# Patient Record
Sex: Female | Born: 1995
Health system: Southern US, Community
[De-identification: ages and names within clinical notes are randomized; demographics above are authoritative.]

## PROBLEM LIST (undated history)

## (undated) DIAGNOSIS — F329 Major depressive disorder, single episode, unspecified: Secondary | ICD-10-CM

## (undated) DIAGNOSIS — F32A Depression, unspecified: Secondary | ICD-10-CM

## (undated) DIAGNOSIS — F319 Bipolar disorder, unspecified: Secondary | ICD-10-CM

## (undated) DIAGNOSIS — J45909 Unspecified asthma, uncomplicated: Secondary | ICD-10-CM

## (undated) DIAGNOSIS — E282 Polycystic ovarian syndrome: Secondary | ICD-10-CM

## (undated) DIAGNOSIS — F419 Anxiety disorder, unspecified: Secondary | ICD-10-CM

## (undated) HISTORY — DX: Unspecified asthma, uncomplicated: J45.909

## (undated) HISTORY — DX: Bipolar disorder, unspecified: F31.9

## (undated) HISTORY — PX: OTHER SURGICAL HISTORY: SHX169

## (undated) HISTORY — DX: Anxiety disorder, unspecified: F41.9

## (undated) HISTORY — DX: Polycystic ovarian syndrome: E28.2

## (undated) HISTORY — PX: EYE SURGERY: SHX253

## (undated) HISTORY — DX: Depression, unspecified: F32.A

---
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
## 2019-02-02 DIAGNOSIS — Z5181 Encounter for therapeutic drug level monitoring: Secondary | ICD-10-CM | POA: Diagnosis not present

## 2019-02-02 DIAGNOSIS — F329 Major depressive disorder, single episode, unspecified: Secondary | ICD-10-CM | POA: Diagnosis not present

## 2019-05-09 DIAGNOSIS — F3181 Bipolar II disorder: Secondary | ICD-10-CM | POA: Diagnosis not present

## 2019-05-23 DIAGNOSIS — F3181 Bipolar II disorder: Secondary | ICD-10-CM | POA: Diagnosis not present

## 2019-05-31 DIAGNOSIS — F3181 Bipolar II disorder: Secondary | ICD-10-CM | POA: Diagnosis not present

## 2019-06-06 DIAGNOSIS — F3181 Bipolar II disorder: Secondary | ICD-10-CM | POA: Diagnosis not present

## 2019-06-07 DIAGNOSIS — Z3044 Encounter for surveillance of vaginal ring hormonal contraceptive device: Secondary | ICD-10-CM | POA: Diagnosis not present

## 2019-06-07 DIAGNOSIS — Z13220 Encounter for screening for lipoid disorders: Secondary | ICD-10-CM | POA: Diagnosis not present

## 2019-06-07 DIAGNOSIS — J45909 Unspecified asthma, uncomplicated: Secondary | ICD-10-CM | POA: Diagnosis not present

## 2019-06-07 DIAGNOSIS — Z131 Encounter for screening for diabetes mellitus: Secondary | ICD-10-CM | POA: Diagnosis not present

## 2019-06-07 DIAGNOSIS — Z0000 Encounter for general adult medical examination without abnormal findings: Secondary | ICD-10-CM | POA: Diagnosis not present

## 2019-06-07 DIAGNOSIS — H6991 Unspecified Eustachian tube disorder, right ear: Secondary | ICD-10-CM | POA: Diagnosis not present

## 2019-06-07 DIAGNOSIS — Z23 Encounter for immunization: Secondary | ICD-10-CM | POA: Diagnosis not present

## 2019-06-07 DIAGNOSIS — E282 Polycystic ovarian syndrome: Secondary | ICD-10-CM | POA: Diagnosis not present

## 2019-06-07 DIAGNOSIS — E669 Obesity, unspecified: Secondary | ICD-10-CM | POA: Diagnosis not present

## 2019-06-12 DIAGNOSIS — F3181 Bipolar II disorder: Secondary | ICD-10-CM | POA: Diagnosis not present

## 2019-06-19 DIAGNOSIS — F3181 Bipolar II disorder: Secondary | ICD-10-CM | POA: Diagnosis not present

## 2019-06-26 DIAGNOSIS — F3181 Bipolar II disorder: Secondary | ICD-10-CM | POA: Diagnosis not present

## 2019-07-03 DIAGNOSIS — F3181 Bipolar II disorder: Secondary | ICD-10-CM | POA: Diagnosis not present

## 2019-07-10 DIAGNOSIS — F3181 Bipolar II disorder: Secondary | ICD-10-CM | POA: Diagnosis not present

## 2019-07-17 DIAGNOSIS — F3181 Bipolar II disorder: Secondary | ICD-10-CM | POA: Diagnosis not present

## 2019-07-24 DIAGNOSIS — F3181 Bipolar II disorder: Secondary | ICD-10-CM | POA: Diagnosis not present

## 2019-07-31 DIAGNOSIS — F3181 Bipolar II disorder: Secondary | ICD-10-CM | POA: Diagnosis not present

## 2019-08-07 DIAGNOSIS — F3181 Bipolar II disorder: Secondary | ICD-10-CM | POA: Diagnosis not present

## 2019-08-14 DIAGNOSIS — F3181 Bipolar II disorder: Secondary | ICD-10-CM | POA: Diagnosis not present

## 2019-08-21 DIAGNOSIS — F3181 Bipolar II disorder: Secondary | ICD-10-CM | POA: Diagnosis not present

## 2019-08-28 DIAGNOSIS — F3181 Bipolar II disorder: Secondary | ICD-10-CM | POA: Diagnosis not present

## 2019-09-04 DIAGNOSIS — F3181 Bipolar II disorder: Secondary | ICD-10-CM | POA: Diagnosis not present

## 2019-09-11 DIAGNOSIS — Z23 Encounter for immunization: Secondary | ICD-10-CM | POA: Diagnosis not present

## 2019-09-11 DIAGNOSIS — R61 Generalized hyperhidrosis: Secondary | ICD-10-CM | POA: Diagnosis not present

## 2019-09-11 DIAGNOSIS — R6889 Other general symptoms and signs: Secondary | ICD-10-CM | POA: Diagnosis not present

## 2019-09-11 DIAGNOSIS — F3181 Bipolar II disorder: Secondary | ICD-10-CM | POA: Diagnosis not present

## 2019-09-18 DIAGNOSIS — F3181 Bipolar II disorder: Secondary | ICD-10-CM | POA: Diagnosis not present

## 2019-09-25 DIAGNOSIS — F3181 Bipolar II disorder: Secondary | ICD-10-CM | POA: Diagnosis not present

## 2019-10-02 DIAGNOSIS — F3181 Bipolar II disorder: Secondary | ICD-10-CM | POA: Diagnosis not present

## 2019-10-09 DIAGNOSIS — F3181 Bipolar II disorder: Secondary | ICD-10-CM | POA: Diagnosis not present

## 2019-10-17 DIAGNOSIS — F3181 Bipolar II disorder: Secondary | ICD-10-CM | POA: Diagnosis not present

## 2019-12-12 DIAGNOSIS — F3181 Bipolar II disorder: Secondary | ICD-10-CM | POA: Diagnosis not present

## 2019-12-19 DIAGNOSIS — F3181 Bipolar II disorder: Secondary | ICD-10-CM | POA: Diagnosis not present

## 2019-12-26 DIAGNOSIS — F3181 Bipolar II disorder: Secondary | ICD-10-CM | POA: Diagnosis not present

## 2020-01-02 DIAGNOSIS — F3181 Bipolar II disorder: Secondary | ICD-10-CM | POA: Diagnosis not present

## 2020-01-09 DIAGNOSIS — F3181 Bipolar II disorder: Secondary | ICD-10-CM | POA: Diagnosis not present

## 2020-01-16 DIAGNOSIS — F3181 Bipolar II disorder: Secondary | ICD-10-CM | POA: Diagnosis not present

## 2020-01-23 DIAGNOSIS — F3181 Bipolar II disorder: Secondary | ICD-10-CM | POA: Diagnosis not present

## 2020-01-30 DIAGNOSIS — F3181 Bipolar II disorder: Secondary | ICD-10-CM | POA: Diagnosis not present

## 2020-02-06 DIAGNOSIS — F3181 Bipolar II disorder: Secondary | ICD-10-CM | POA: Diagnosis not present

## 2020-02-20 DIAGNOSIS — F3181 Bipolar II disorder: Secondary | ICD-10-CM | POA: Diagnosis not present

## 2020-02-24 ENCOUNTER — Ambulatory Visit: Payer: Self-pay

## 2020-02-27 DIAGNOSIS — F3181 Bipolar II disorder: Secondary | ICD-10-CM | POA: Diagnosis not present

## 2020-03-02 ENCOUNTER — Ambulatory Visit: Payer: Self-pay

## 2020-03-05 DIAGNOSIS — F3181 Bipolar II disorder: Secondary | ICD-10-CM | POA: Diagnosis not present

## 2020-03-12 DIAGNOSIS — F3181 Bipolar II disorder: Secondary | ICD-10-CM | POA: Diagnosis not present

## 2020-03-14 ENCOUNTER — Encounter: Payer: Self-pay | Admitting: Family Medicine

## 2020-03-14 ENCOUNTER — Ambulatory Visit (INDEPENDENT_AMBULATORY_CARE_PROVIDER_SITE_OTHER): Payer: BC Managed Care – PPO | Admitting: Family Medicine

## 2020-03-14 VITALS — Ht 63.0 in | Wt 200.0 lb

## 2020-03-14 DIAGNOSIS — Z7689 Persons encountering health services in other specified circumstances: Secondary | ICD-10-CM

## 2020-03-14 DIAGNOSIS — Z79899 Other long term (current) drug therapy: Secondary | ICD-10-CM | POA: Diagnosis not present

## 2020-03-14 DIAGNOSIS — L700 Acne vulgaris: Secondary | ICD-10-CM | POA: Diagnosis not present

## 2020-03-14 DIAGNOSIS — F3160 Bipolar disorder, current episode mixed, unspecified: Secondary | ICD-10-CM

## 2020-03-14 DIAGNOSIS — M542 Cervicalgia: Secondary | ICD-10-CM

## 2020-03-14 DIAGNOSIS — R198 Other specified symptoms and signs involving the digestive system and abdomen: Secondary | ICD-10-CM

## 2020-03-14 DIAGNOSIS — J4520 Mild intermittent asthma, uncomplicated: Secondary | ICD-10-CM

## 2020-03-14 DIAGNOSIS — L680 Hirsutism: Secondary | ICD-10-CM

## 2020-03-14 DIAGNOSIS — H938X1 Other specified disorders of right ear: Secondary | ICD-10-CM

## 2020-03-14 MED ORDER — DICYCLOMINE HCL 10 MG PO CAPS: 10.0000 mg | ORAL_CAPSULE | Freq: Three times a day (TID) | ORAL | 1 refills | Status: AC

## 2020-03-14 MED ORDER — AZELASTINE HCL 0.1 % NA SOLN: 1.0000 | Freq: Two times a day (BID) | NASAL | 5 refills | Status: AC

## 2020-03-14 MED ORDER — ALBUTEROL SULFATE HFA 108 (90 BASE) MCG/ACT IN AERS: 2.0000 | INHALATION_SPRAY | Freq: Four times a day (QID) | RESPIRATORY_TRACT | 5 refills | Status: AC | PRN

## 2020-03-14 MED ORDER — SPIRONOLACTONE 100 MG PO TABS: 200.0000 mg | ORAL_TABLET | Freq: Every day | ORAL | 5 refills | Status: DC

## 2020-03-14 MED ORDER — TRETINOIN 0.075 % EX CREA: 1.0000 "application " | TOPICAL_CREAM | Freq: Every day | CUTANEOUS | 5 refills | Status: DC | PRN

## 2020-03-14 NOTE — Progress Notes (Signed)
Ht 5\' 3"  (1.6 m)   Wt 200 lb (90.7 kg)   BMI 35.43 kg/m    Subjective:    Patient ID: , female    DOB: [birth year], 24 y.o.   MRN: [id number]  HPI: Brianna Carney is a 24 y.o. female  Chief Complaint  Patient presents with  . Establish Care  . Gastroesophageal Reflux  . Back Pain    . This visit was completed via MyChart due to the restrictions of the COVID-19 pandemic. All issues as above were discussed and addressed. Physical exam was done as above through visual confirmation on MyChart. If it was felt that the patient should be evaluated in the office, they were directed there. The patient verbally consented to this visit. . Location of the patient: home . Location of the provider: home . Those involved with this call:  . Provider: 25, PA-C . CMA: [name], CMA . Front Desk/Registration: [name]  . Time spent on call: 30 minutes with patient face to face via video conference. More than 50% of this time was spent in counseling and coordination of care. 10 minutes total spent in review of patient's record and preparation of their chart. I verified patient identity using two factors (patient name and date of birth). Patient consents verbally to being seen via telemedicine visit today.   Presenting today to establish care.   On spironolactone and tretinoin cream for acne, tolerating well.   Following with Psychiatry for bipolar depression and anxiety, currently stable on trazodone, lithium, and xanax regimen.   Using albuterol 1-2 times per month for asthma, mostly needed with exercise.   Constipation and diarrhea, indigestion, cramping for years to the point where she's scared to eat sometimes because she's not sure how her body is going to handle it. Tries imodium and miralax as needed, but hard to know what to take each day as it varies. Very bothersome sxs for her. Has never seen GI in the past.   1 week of upper back neck pain, no UE numbness,  tingling, weakness. Denies known injury. No imaging in the past. Not trying anything for sxs.   Right ear pressure and muffled hearing. Known hx of allergies, not trying anything for sxs. Denies ear pain, drainage, fevers, chills.   Relevant past medical, surgical, family and social history reviewed and updated as indicated. Interim medical history since our last visit reviewed. Allergies and medications reviewed and updated.  Review of Systems  Per HPI unless specifically indicated above     Objective:    Ht 5\' 3"  (1.6 m)   Wt 200 lb (90.7 kg)   BMI 35.43 kg/m   Wt Readings from Last 3 Encounters:  03/14/20 200 lb (90.7 kg)    Physical Exam  No results found for this or any previous visit.    Assessment & Plan:   Problem List Items Addressed This Visit      Respiratory   Asthma    Stable and under good control, continue current regimen      Relevant Medications   albuterol (VENTOLIN HFA) 108 (90 Base) MCG/ACT inhaler     Musculoskeletal and Integument   Acne vulgaris    Stable and under good control, continue current regimen      Relevant Medications   Tretinoin 0.075 % CREA   Hirsutism    Stable, fairly well controlled. Continue current regimen and consider laser hair removal for further improvement      Relevant Orders  Basic metabolic panel     Other   Bipolar disorder ([name])    Followed by Psychiatry, continue current regimen per their recommendations       Other Visit Diagnoses    Acute neck pain    -  Primary   Obtain x-ray, discussed home exercises and symptomatic care.    Relevant Orders   DG Cervical Spine Complete   Encounter to establish care       Alternating constipation and diarrhea       Suspect IBS, trial fiber, probiotics daily and can trial bentyl prn. Can try eliminating certain foods. GI if no improvement   Ear pressure, right       Suspect effusion, trial astelin nasal spray. F/u in person for recheck if not better        Follow up plan: Return in about 4 weeks (around 04/11/2020) for CPE.

## 2020-03-18 DIAGNOSIS — F3181 Bipolar II disorder: Secondary | ICD-10-CM | POA: Diagnosis not present

## 2020-03-19 DIAGNOSIS — F3181 Bipolar II disorder: Secondary | ICD-10-CM | POA: Diagnosis not present

## 2020-03-24 DIAGNOSIS — F319 Bipolar disorder, unspecified: Secondary | ICD-10-CM | POA: Insufficient documentation

## 2020-03-24 DIAGNOSIS — J45909 Unspecified asthma, uncomplicated: Secondary | ICD-10-CM | POA: Insufficient documentation

## 2020-03-24 NOTE — Assessment & Plan Note (Signed)
Stable and under good control, continue current regimen 

## 2020-03-24 NOTE — Assessment & Plan Note (Signed)
Followed by Psychiatry, continue current regimen per their recommendations 

## 2020-03-24 NOTE — Assessment & Plan Note (Signed)
Stable, fairly well controlled. Continue current regimen and consider laser hair removal for further improvement

## 2020-03-25 ENCOUNTER — Telehealth: Payer: Self-pay | Admitting: Family Medicine

## 2020-03-25 NOTE — Telephone Encounter (Signed)
-----   Message from Particia Nearing, [place] sent at 03/24/2020 10:53 PM EDT ----- 1 month anxiety f/u

## 2020-03-25 NOTE — Telephone Encounter (Signed)
LVM for pt to call back to schedule appt

## 2020-03-27 DIAGNOSIS — F3181 Bipolar II disorder: Secondary | ICD-10-CM | POA: Diagnosis not present

## 2020-04-01 NOTE — Telephone Encounter (Signed)
LVM for pt to call back.

## 2020-04-02 DIAGNOSIS — F3181 Bipolar II disorder: Secondary | ICD-10-CM | POA: Diagnosis not present

## 2020-04-09 DIAGNOSIS — F3181 Bipolar II disorder: Secondary | ICD-10-CM | POA: Diagnosis not present

## 2020-04-11 DIAGNOSIS — F3181 Bipolar II disorder: Secondary | ICD-10-CM | POA: Diagnosis not present

## 2020-04-16 DIAGNOSIS — F3181 Bipolar II disorder: Secondary | ICD-10-CM | POA: Diagnosis not present

## 2020-04-23 DIAGNOSIS — F3181 Bipolar II disorder: Secondary | ICD-10-CM | POA: Diagnosis not present

## 2020-04-29 ENCOUNTER — Other Ambulatory Visit: Payer: Self-pay

## 2020-04-29 ENCOUNTER — Encounter: Payer: Self-pay | Admitting: Family Medicine

## 2020-04-29 ENCOUNTER — Ambulatory Visit (INDEPENDENT_AMBULATORY_CARE_PROVIDER_SITE_OTHER): Payer: BC Managed Care – PPO | Admitting: Family Medicine

## 2020-04-29 VITALS — BP 125/80 | HR 84 | Temp 99.2°F | Wt 208.0 lb

## 2020-04-29 DIAGNOSIS — F3160 Bipolar disorder, current episode mixed, unspecified: Secondary | ICD-10-CM

## 2020-04-29 DIAGNOSIS — R7989 Other specified abnormal findings of blood chemistry: Secondary | ICD-10-CM | POA: Diagnosis not present

## 2020-04-29 DIAGNOSIS — L680 Hirsutism: Secondary | ICD-10-CM | POA: Diagnosis not present

## 2020-04-29 DIAGNOSIS — L700 Acne vulgaris: Secondary | ICD-10-CM

## 2020-04-29 DIAGNOSIS — R5383 Other fatigue: Secondary | ICD-10-CM | POA: Diagnosis not present

## 2020-04-29 MED ORDER — TRETINOIN 0.075 % EX CREA: 1.0000 "application " | TOPICAL_CREAM | Freq: Every day | CUTANEOUS | 5 refills | Status: DC | PRN

## 2020-04-29 MED ORDER — ETONOGESTREL-ETHINYL ESTRADIOL 0.12-0.015 MG/24HR VA RING: 1.0000 | VAGINAL_RING | VAGINAL | 3 refills | Status: DC

## 2020-04-29 NOTE — Progress Notes (Signed)
BP 125/80   Pulse 84   Temp 99.2 F (37.3 C) (Oral)   Wt 208 lb (94.3 kg)   SpO2 99%   BMI 36.85 kg/m    Subjective:    Patient ID: Brianna Carney, female    DOB: 1995-12-11, 24 y.o.   MRN: [id number]  HPI: Brianna Carney is a 24 y.o. female  Chief Complaint  Patient presents with  . Anxiety   Presenting today with several concerns for f/u.   Following with Psychiatry for anxiety and depression, recent labs there showed elevated TSH and she was told to ask PCP about this. Weight cycling, fatigue, irregular periods prior to nuva ring all that she feels could be related to thyroid dysfunction.   Has not been using the bentyl for her GI sxs often because she forgets. Maybe some benefit from it when she does remember. Overall doing fairly well with regard to this right now.   Notes she was unable to pick up her retin a cream at pharmacy, requesting rx to be re-sent. This has helped with her acne sxs in the past.   Depression screen [facility] 2/9 04/29/2020 03/14/2020  Decreased Interest 2 0  Down, Depressed, Hopeless 2 1  PHQ - 2 Score 4 1  Altered sleeping 3 1  Tired, decreased energy 2 0  Change in appetite - 0  Feeling bad or failure about yourself  - 3  Trouble concentrating - 1  Moving slowly or fidgety/restless - 0  Suicidal thoughts - 1  PHQ-9 Score 9 7  Difficult doing work/chores - Somewhat difficult   GAD 7 : Generalized Anxiety Score 03/14/2020  Nervous, Anxious, on Edge 3  Control/stop worrying 3  Worry too much - different things 2  Trouble relaxing 1  Restless 1  Easily annoyed or irritable 3  Afraid - awful might happen 2  Total GAD 7 Score 15  Anxiety Difficulty Somewhat difficult   Relevant past medical, surgical, family and social history reviewed and updated as indicated. Interim medical history since our last visit reviewed. Allergies and medications reviewed and updated.  Review of Systems  Per HPI unless specifically indicated above     Objective:    BP  125/80   Pulse 84   Temp 99.2 F (37.3 C) (Oral)   Wt 208 lb (94.3 kg)   SpO2 99%   BMI 36.85 kg/m   Wt Readings from Last 3 Encounters:  04/29/20 208 lb (94.3 kg)  03/14/20 200 lb (90.7 kg)    Physical Exam Vitals and nursing note reviewed.  Constitutional:      Appearance: Normal appearance. She is not ill-appearing.  HENT:     Head: Atraumatic.  Eyes:     Extraocular Movements: Extraocular movements intact.     Conjunctiva/sclera: Conjunctivae normal.  Cardiovascular:     Rate and Rhythm: Normal rate and regular rhythm.     Heart sounds: Normal heart sounds.  Pulmonary:     Effort: Pulmonary effort is normal.     Breath sounds: Normal breath sounds.  Musculoskeletal:        General: Normal range of motion.     Cervical back: Normal range of motion and neck supple.  Skin:    General: Skin is warm and dry.  Neurological:     Mental Status: She is alert and oriented to person, place, and time.  Psychiatric:        Mood and Affect: Mood normal.        Thought  Content: Thought content normal.        Judgment: Judgment normal.     Results for orders placed or performed in visit on 04/29/20  Thyroid Panel With TSH  Result Value Ref Range   TSH 7.390 (H) 0.450 - 4.500 uIU/mL   T4, Total 4.6 4.5 - 12.0 ug/dL   T3 Uptake Ratio 26 24 - 39 %   Free Thyroxine Index 1.2 1.2 - 4.9  Basic metabolic panel  Result Value Ref Range   Glucose 80 65 - 99 mg/dL   BUN 18 6 - 20 mg/dL   Creatinine, Ser 0.70 0.57 - 1.00 mg/dL   GFR calc non Af Amer 121 >59 mL/min/1.73   GFR calc Af Amer 139 >59 mL/min/1.73   BUN/Creatinine Ratio 26 (H) 9 - 23   Sodium 139 134 - 144 mmol/L   Potassium 4.8 3.5 - 5.2 mmol/L   Chloride 102 96 - 106 mmol/L   CO2 21 20 - 29 mmol/L   Calcium 7.9 (L) 8.7 - 10.2 mg/dL      Assessment & Plan:   Problem List Items Addressed This Visit      Musculoskeletal and Integument   Acne vulgaris    Will re-send cream, monitor for benefit      Hirsutism     Continue spironolactone, tolerating well. Check BMP today        Other   Bipolar disorder ([place])    Followed by Psychiatry, continue per their recommendations       Other Visit Diagnoses    Elevated TSH    -  Primary   Does not have lab records from Psychiatry. Will check TSH and treat as needed   Relevant Orders   Thyroid Panel With TSH (Completed)       Follow up plan: Return in about 5 months (around 09/29/2020) for CPE with pap.

## 2020-04-30 ENCOUNTER — Other Ambulatory Visit: Payer: Self-pay | Admitting: Family Medicine

## 2020-04-30 LAB — BASIC METABOLIC PANEL
BUN/Creatinine Ratio: 26 — ABNORMAL HIGH (ref 9–23)
BUN: 18 mg/dL (ref 6–20)
CO2: 21 mmol/L (ref 20–29)
Calcium: 7.9 mg/dL — ABNORMAL LOW (ref 8.7–10.2)
Chloride: 102 mmol/L (ref 96–106)
Creatinine, Ser: 0.7 mg/dL (ref 0.57–1.00)
GFR calc Af Amer: 139 mL/min/{1.73_m2} (ref >59)
GFR calc non Af Amer: 121 mL/min/{1.73_m2} (ref >59)
Glucose: 80 mg/dL (ref 65–99)
Potassium: 4.8 mmol/L (ref 3.5–5.2)
Sodium: 139 mmol/L (ref 134–144)

## 2020-04-30 LAB — THYROID PANEL WITH TSH
Free Thyroxine Index: 1.2 (ref 1.2–4.9)
T3 Uptake Ratio: 26 % (ref 24–39)
T4, Total: 4.6 ug/dL (ref 4.5–12.0)
TSH: 7.39 u[IU]/mL — ABNORMAL HIGH (ref 0.450–4.500)

## 2020-04-30 MED ORDER — TRETINOIN 0.05 % EX CREA: TOPICAL_CREAM | Freq: Every day | CUTANEOUS | 2 refills | Status: DC

## 2020-05-01 ENCOUNTER — Other Ambulatory Visit: Payer: Self-pay | Admitting: Family Medicine

## 2020-05-01 DIAGNOSIS — E039 Hypothyroidism, unspecified: Secondary | ICD-10-CM

## 2020-05-01 MED ORDER — LEVOTHYROXINE SODIUM 25 MCG PO TABS: 25.0000 ug | ORAL_TABLET | Freq: Every day | ORAL | 1 refills | Status: DC

## 2020-05-06 NOTE — Assessment & Plan Note (Signed)
Continue spironolactone, tolerating well. Check BMP today

## 2020-05-06 NOTE — Assessment & Plan Note (Signed)
Will re-send cream, monitor for benefit

## 2020-05-06 NOTE — Assessment & Plan Note (Signed)
Followed by Psychiatry, continue per their recommendations 

## 2020-05-07 DIAGNOSIS — F3181 Bipolar II disorder: Secondary | ICD-10-CM | POA: Diagnosis not present

## 2020-05-09 DIAGNOSIS — F39 Unspecified mood [affective] disorder: Secondary | ICD-10-CM | POA: Diagnosis not present

## 2020-05-14 DIAGNOSIS — F3181 Bipolar II disorder: Secondary | ICD-10-CM | POA: Diagnosis not present

## 2020-05-28 DIAGNOSIS — F603 Borderline personality disorder: Secondary | ICD-10-CM | POA: Diagnosis not present

## 2020-06-04 DIAGNOSIS — F603 Borderline personality disorder: Secondary | ICD-10-CM | POA: Diagnosis not present

## 2020-06-11 DIAGNOSIS — F603 Borderline personality disorder: Secondary | ICD-10-CM | POA: Diagnosis not present

## 2020-06-18 DIAGNOSIS — F603 Borderline personality disorder: Secondary | ICD-10-CM | POA: Diagnosis not present

## 2020-07-02 ENCOUNTER — Encounter: Payer: Self-pay | Admitting: Family Medicine

## 2020-07-02 DIAGNOSIS — F603 Borderline personality disorder: Secondary | ICD-10-CM | POA: Diagnosis not present

## 2020-07-05 ENCOUNTER — Other Ambulatory Visit: Payer: Self-pay | Admitting: Family Medicine

## 2020-07-05 ENCOUNTER — Other Ambulatory Visit: Payer: Self-pay

## 2020-07-05 ENCOUNTER — Other Ambulatory Visit: Payer: BC Managed Care – PPO

## 2020-07-05 DIAGNOSIS — E039 Hypothyroidism, unspecified: Secondary | ICD-10-CM

## 2020-07-06 LAB — TSH: TSH: 4.08 u[IU]/mL (ref 0.450–4.500)

## 2020-07-09 DIAGNOSIS — F603 Borderline personality disorder: Secondary | ICD-10-CM | POA: Diagnosis not present

## 2020-07-16 DIAGNOSIS — F603 Borderline personality disorder: Secondary | ICD-10-CM | POA: Diagnosis not present

## 2020-07-17 ENCOUNTER — Other Ambulatory Visit: Payer: Self-pay | Admitting: Family Medicine

## 2020-07-23 DIAGNOSIS — F603 Borderline personality disorder: Secondary | ICD-10-CM | POA: Diagnosis not present

## 2020-07-30 DIAGNOSIS — F332 Major depressive disorder, recurrent severe without psychotic features: Secondary | ICD-10-CM | POA: Diagnosis not present

## 2020-08-06 DIAGNOSIS — F603 Borderline personality disorder: Secondary | ICD-10-CM | POA: Diagnosis not present

## 2020-08-14 DIAGNOSIS — F603 Borderline personality disorder: Secondary | ICD-10-CM | POA: Diagnosis not present

## 2020-08-20 DIAGNOSIS — F603 Borderline personality disorder: Secondary | ICD-10-CM | POA: Diagnosis not present

## 2020-08-27 DIAGNOSIS — F603 Borderline personality disorder: Secondary | ICD-10-CM | POA: Diagnosis not present

## 2020-09-03 DIAGNOSIS — F603 Borderline personality disorder: Secondary | ICD-10-CM | POA: Diagnosis not present

## 2020-09-10 DIAGNOSIS — F603 Borderline personality disorder: Secondary | ICD-10-CM | POA: Diagnosis not present

## 2020-09-17 DIAGNOSIS — F603 Borderline personality disorder: Secondary | ICD-10-CM | POA: Diagnosis not present

## 2020-09-23 DIAGNOSIS — F603 Borderline personality disorder: Secondary | ICD-10-CM | POA: Diagnosis not present

## 2020-09-24 DIAGNOSIS — F603 Borderline personality disorder: Secondary | ICD-10-CM | POA: Diagnosis not present

## 2020-10-01 DIAGNOSIS — F603 Borderline personality disorder: Secondary | ICD-10-CM | POA: Diagnosis not present

## 2020-10-05 ENCOUNTER — Encounter: Payer: Self-pay | Admitting: Nurse Practitioner

## 2020-10-05 DIAGNOSIS — E039 Hypothyroidism, unspecified: Secondary | ICD-10-CM | POA: Insufficient documentation

## 2020-10-08 DIAGNOSIS — F603 Borderline personality disorder: Secondary | ICD-10-CM | POA: Diagnosis not present

## 2020-10-09 ENCOUNTER — Ambulatory Visit (INDEPENDENT_AMBULATORY_CARE_PROVIDER_SITE_OTHER): Payer: BC Managed Care – PPO | Admitting: Nurse Practitioner

## 2020-10-09 ENCOUNTER — Encounter: Payer: BC Managed Care – PPO | Admitting: Family Medicine

## 2020-10-09 ENCOUNTER — Encounter: Payer: Self-pay | Admitting: Nurse Practitioner

## 2020-10-09 ENCOUNTER — Other Ambulatory Visit (HOSPITAL_COMMUNITY)
Admission: RE | Admit: 2020-10-09 | Discharge: 2020-10-09 | Disposition: A | Discharge status: Home / Self Care | Payer: BC Managed Care – PPO | Source: Ambulatory Visit | Attending: Family Medicine | Admitting: Family Medicine

## 2020-10-09 ENCOUNTER — Other Ambulatory Visit: Payer: Self-pay | Admitting: Nurse Practitioner

## 2020-10-09 ENCOUNTER — Other Ambulatory Visit: Payer: Self-pay

## 2020-10-09 VITALS — BP 106/68 | HR 80 | Temp 98.3°F | Ht 62.48 in | Wt 227.4 lb

## 2020-10-09 DIAGNOSIS — Z0000 Encounter for general adult medical examination without abnormal findings: Secondary | ICD-10-CM

## 2020-10-09 DIAGNOSIS — Z114 Encounter for screening for human immunodeficiency virus [HIV]: Secondary | ICD-10-CM | POA: Diagnosis not present

## 2020-10-09 DIAGNOSIS — Z1329 Encounter for screening for other suspected endocrine disorder: Secondary | ICD-10-CM | POA: Diagnosis not present

## 2020-10-09 DIAGNOSIS — J4520 Mild intermittent asthma, uncomplicated: Secondary | ICD-10-CM

## 2020-10-09 DIAGNOSIS — Z124 Encounter for screening for malignant neoplasm of cervix: Secondary | ICD-10-CM

## 2020-10-09 DIAGNOSIS — Z13220 Encounter for screening for lipoid disorders: Secondary | ICD-10-CM

## 2020-10-09 DIAGNOSIS — Z1159 Encounter for screening for other viral diseases: Secondary | ICD-10-CM | POA: Diagnosis not present

## 2020-10-09 DIAGNOSIS — H938X1 Other specified disorders of right ear: Secondary | ICD-10-CM

## 2020-10-09 DIAGNOSIS — Z23 Encounter for immunization: Secondary | ICD-10-CM | POA: Diagnosis not present

## 2020-10-09 MED ORDER — SPIRONOLACTONE 100 MG PO TABS: ORAL_TABLET | ORAL | 4 refills | Status: AC

## 2020-10-09 MED ORDER — LEVOTHYROXINE SODIUM 25 MCG PO TABS: ORAL_TABLET | ORAL | 4 refills | Status: DC

## 2020-10-09 MED ORDER — CLINDAMYCIN PHOS-BENZOYL PEROX 1-5 % EX GEL: Freq: Two times a day (BID) | CUTANEOUS | 4 refills | Status: AC

## 2020-10-09 MED ORDER — ETONOGESTREL-ETHINYL ESTRADIOL 0.12-0.015 MG/24HR VA RING: 1.0000 | VAGINAL_RING | VAGINAL | 4 refills | Status: AC

## 2020-10-09 NOTE — Progress Notes (Signed)
BP 106/68   Pulse 80   Temp 98.3 F (36.8 C) (Oral)   Ht 5' 2.48" (1.587 m)   Wt 227 lb 6.4 oz (103.1 kg)   LMP  (LMP Unknown)   SpO2 98%   BMI 40.96 kg/m    Subjective:    Patient ID: Brianna Carney, female    DOB: 17-Oct-1996, 24 y.o.   MRN: [id number]  HPI: Brianna Carney is a 24 y.o. female presenting on 10/09/2020 for comprehensive medical examination. Current medical complaints include:none  She currently lives with: self Menopausal Symptoms: no  Depression Screen done today and results listed below:  Depression screen [facility] 2/9 04/29/2020 03/14/2020  Decreased Interest 2 0  Down, Depressed, Hopeless 2 1  PHQ - 2 Score 4 1  Altered sleeping 3 1  Tired, decreased energy 2 0  Change in appetite - 0  Feeling bad or failure about yourself  - 3  Trouble concentrating - 1  Moving slowly or fidgety/restless - 0  Suicidal thoughts - 1  PHQ-9 Score 9 7  Difficult doing work/chores - Somewhat difficult    The patient does not have a history of falls. I did not complete a risk assessment for falls. A plan of care for falls was not documented.   Past Medical History:  Past Medical History:  Diagnosis Date  . Anxiety   . Asthma   . Bipolar disorder ([facility])   . Depression   . PCOS (polycystic ovarian syndrome)     Surgical History:  Past Surgical History:  Procedure Laterality Date  . EYE SURGERY    . left knee surgery      Medications:  Current Outpatient Medications on File Prior to Visit  Medication Sig  . albuterol (VENTOLIN HFA) 108 (90 Base) MCG/ACT inhaler Inhale 2 puffs into the lungs every 6 (six) hours as needed.  . ALPRAZolam (XANAX) 0.5 MG tablet Take by mouth as needed.   Marland Kitchen azelastine (ASTELIN) 0.1 % nasal spray Place 1 spray into both nostrils 2 (two) times daily. Use in each nostril as directed  . dicyclomine (BENTYL) 10 MG capsule Take 1 capsule (10 mg total) by mouth 4 (four) times daily -  before meals and at bedtime.  Marland Kitchen lithium carbonate (LITHOBID) 300 MG  CR tablet Take 900 mg by mouth at bedtime.  Marland Kitchen buPROPion (WELLBUTRIN XL) 150 MG 24 hr tablet Take 150 mg by mouth daily.  Marland Kitchen gabapentin (NEURONTIN) 300 MG capsule Take 300 mg by mouth daily.  . mirtazapine (REMERON) 30 MG tablet Take 30 mg by mouth at bedtime.   No current facility-administered medications on file prior to visit.    Allergies:  Allergies  Allergen Reactions  . Latex Itching and Rash    Social History:  Social History   Socioeconomic History  . Marital status: Single    Spouse name: Not on file  . Number of children: Not on file  . Years of education: Not on file  . Highest education level: Not on file  Occupational History  . Not on file  Tobacco Use  . Smoking status: Never Smoker  . Smokeless tobacco: Never Used  Vaping Use  . Vaping Use: Never used  Substance and Sexual Activity  . Alcohol use: Yes    Comment: occationally  . Drug use: Never  . Sexual activity: Never  Other Topics Concern  . Not on file  Social History Narrative  . Not on file   Social Determinants of Health  Financial Resource Strain:   . Difficulty of Paying Living Expenses: Not on file  Food Insecurity:   . Worried About Programme researcher, broadcasting/film/videounning Out of Food in the Last Year: Not on file  . Ran Out of Food in the Last Year: Not on file  Transportation Needs:   . Lack of Transportation (Medical): Not on file  . Lack of Transportation (Non-Medical): Not on file  Physical Activity:   . Days of Exercise per Week: Not on file  . Minutes of Exercise per Session: Not on file  Stress:   . Feeling of Stress : Not on file  Social Connections:   . Frequency of Communication with Friends and Family: Not on file  . Frequency of Social Gatherings with Friends and Family: Not on file  . Attends Religious Services: Not on file  . Active Member of Clubs or Organizations: Not on file  . Attends BankerClub or Organization Meetings: Not on file  . Marital Status: Not on file  Intimate Partner Violence:   .  Fear of Current or Ex-Partner: Not on file  . Emotionally Abused: Not on file  . Physically Abused: Not on file  . Sexually Abused: Not on file   Social History   Tobacco Use  Smoking Status Never Smoker  Smokeless Tobacco Never Used   Social History   Substance and Sexual Activity  Alcohol Use Yes   Comment: occationally    Family History:  Family History  Problem Relation Age of Onset  . Colon cancer Maternal Grandmother   . Lung cancer Maternal Grandfather   . Breast cancer Paternal Grandmother   . Heart disease Paternal Grandfather     Past medical history, surgical history, medications, allergies, family history and social history reviewed with patient today and changes made to appropriate areas of the chart.   Review of Systems - negative All other ROS negative except what is listed above and in the HPI.      Objective:    BP 106/68   Pulse 80   Temp 98.3 F (36.8 C) (Oral)   Ht 5' 2.48" (1.587 m)   Wt 227 lb 6.4 oz (103.1 kg)   LMP  (LMP Unknown)   SpO2 98%   BMI 40.96 kg/m   Wt Readings from Last 3 Encounters:  10/09/20 227 lb 6.4 oz (103.1 kg)  04/29/20 208 lb (94.3 kg)  03/14/20 200 lb (90.7 kg)    Physical Exam Vitals and nursing note reviewed.  Constitutional:      General: She is awake. She is not in acute distress.    Appearance: She is well-developed. She is not ill-appearing.  HENT:     Head: Normocephalic and atraumatic.     Right Ear: Hearing, tympanic membrane, ear canal and external ear normal. No drainage.     Left Ear: Hearing, tympanic membrane, ear canal and external ear normal. No drainage.     Nose: Nose normal.     Right Sinus: No maxillary sinus tenderness or frontal sinus tenderness.     Left Sinus: No maxillary sinus tenderness or frontal sinus tenderness.     Mouth/Throat:     Mouth: Mucous membranes are moist.     Pharynx: Oropharynx is clear. Uvula midline. No pharyngeal swelling, oropharyngeal exudate or posterior  oropharyngeal erythema.  Eyes:     General: Lids are normal.        Right eye: No discharge.        Left eye: No discharge.  Extraocular Movements: Extraocular movements intact.     Conjunctiva/sclera: Conjunctivae normal.     Pupils: Pupils are equal, round, and reactive to light.     Visual Fields: Right eye visual fields normal and left eye visual fields normal.  Neck:     Thyroid: No thyromegaly.     Vascular: No carotid bruit.     Trachea: Trachea normal.  Cardiovascular:     Rate and Rhythm: Normal rate and regular rhythm.     Heart sounds: Normal heart sounds. No murmur heard.  No gallop.   Pulmonary:     Effort: Pulmonary effort is normal. No accessory muscle usage or respiratory distress.     Breath sounds: Normal breath sounds.  Chest:     Breasts:        Right: Normal.        Left: Normal.  Abdominal:     General: Bowel sounds are normal.     Palpations: Abdomen is soft. There is no hepatomegaly or splenomegaly.     Tenderness: There is no abdominal tenderness.     Hernia: There is no hernia in the left inguinal area or right inguinal area.  Genitourinary:    Exam position: Lithotomy position.     Labia:        Right: No rash.        Left: No rash.      Vagina: Normal.     Cervix: Normal.     Uterus: Normal.      Adnexa: Right adnexa normal and left adnexa normal.     Comments: Chaperone decline.  Pap obtained, cervix anterior and viewed.   Musculoskeletal:        General: Normal range of motion.     Cervical back: Normal range of motion and neck supple.     Right lower leg: No edema.     Left lower leg: No edema.  Lymphadenopathy:     Head:     Right side of head: No submental, submandibular, tonsillar, preauricular or posterior auricular adenopathy.     Left side of head: No submental, submandibular, tonsillar, preauricular or posterior auricular adenopathy.     Cervical: No cervical adenopathy.     Upper Body:     Right upper body: No  supraclavicular, axillary or pectoral adenopathy.     Left upper body: No supraclavicular, axillary or pectoral adenopathy.  Skin:    General: Skin is warm and dry.     Capillary Refill: Capillary refill takes less than 2 seconds.     Findings: No rash.  Neurological:     Mental Status: She is alert and oriented to person, place, and time.     Cranial Nerves: Cranial nerves are intact.     Gait: Gait is intact.     Deep Tendon Reflexes: Reflexes are normal and symmetric.     Reflex Scores:      Brachioradialis reflexes are 2+ on the right side and 2+ on the left side.      Patellar reflexes are 2+ on the right side and 2+ on the left side. Psychiatric:        Attention and Perception: Attention normal.        Mood and Affect: Mood normal.        Speech: Speech normal.        Behavior: Behavior normal. Behavior is cooperative.        Thought Content: Thought content normal.        Judgment:  Judgment normal.    Results for orders placed or performed in visit on 07/05/20  TSH  Result Value Ref Range   TSH 4.080 0.450 - 4.500 uIU/mL      Assessment & Plan:   Problem List Items Addressed This Visit    None    Visit Diagnoses    Encounter for annual physical exam    -  Primary   Annual labs obtained to include CBC, CMP, TSH, lipid   Relevant Orders   CBC with Differential/Platelet   Comprehensive metabolic panel   Thyroid disorder screen       TSH on labs today.   Relevant Orders   TSH   Screening cholesterol level       Lipid panel on labs today.   Relevant Orders   Lipid Panel w/o Chol/HDL Ratio   Need for hepatitis C screening test       Hep C screening x 1 one time per guideline recommendations.   Relevant Orders   Hepatitis C antibody   Encounter for screening for HIV       HIV screening x 1 one time per guideline recommendations.   Relevant Orders   HIV Antibody (routine testing w rflx)   Cervical cancer screening       Pap obtained today and sent to lab.    Relevant Orders   Cytology - PAP   Need for Tdap vaccination       Tetanus vaccine today   Relevant Orders   Tdap vaccine greater than or equal to 7yo IM (Completed)   Need for influenza vaccination       Flu vaccine today   Relevant Orders   Flu Vaccine QUAD 6+ mos PF IM (Fluarix Quad PF) (Completed)       Follow up plan: Return in about 1 year (around 10/09/2021) for Annual physical.   LABORATORY TESTING:  - Pap smear: pap done  -- she reports 1st pap at 24 years old in [place], [place] -- she is not sexually active and has not been -- discussed hold off on repeat pap, but she wishes to repeat today  IMMUNIZATIONS:   - Tdap: Tetanus vaccination status reviewed: last tetanus booster within 10 years. - Influenza: Up to date - Pneumovax: Not applicable - Prevnar: Not applicable - HPV: Not applicable - Zostavax vaccine: Not applicable  SCREENING: -Mammogram: Not applicable  - Colonoscopy: Not applicable  - Bone Density: Not applicable  -Hearing Test: Not applicable  -Spirometry: Not applicable   PATIENT COUNSELING:   Advised to take 1 mg of folate supplement per day if capable of pregnancy.   Sexuality: Discussed sexually transmitted diseases, partner selection, use of condoms, avoidance of unintended pregnancy  and contraceptive alternatives.   Advised to avoid cigarette smoking.  I discussed with the patient that most people either abstain from alcohol or drink within safe limits (<=14/week and <=4 drinks/occasion for males, <=7/weeks and <= 3 drinks/occasion for females) and that the risk for alcohol disorders and other health effects rises proportionally with the number of drinks per week and how often a drinker exceeds daily limits.  Discussed cessation/primary prevention of drug use and availability of treatment for abuse.   Diet: Encouraged to adjust caloric intake to maintain  or achieve ideal body weight, to reduce intake of dietary saturated fat and total fat, to  limit sodium intake by avoiding high sodium foods and not adding table salt, and to maintain adequate dietary potassium and calcium preferably from fresh  fruits, vegetables, and low-fat dairy products.    stressed the importance of regular exercise  Injury prevention: Discussed safety belts, safety helmets, smoke detector, smoking near bedding or upholstery.   Dental health: Discussed importance of regular tooth brushing, flossing, and dental visits.    NEXT PREVENTATIVE PHYSICAL DUE IN 1 YEAR. Return in about 1 year (around 10/09/2021) for Annual physical.

## 2020-10-09 NOTE — Patient Instructions (Signed)

## 2020-10-09 NOTE — Progress Notes (Signed)
Patient requesting referral to ENT for ongoing ear pressure right side with intermittent hearing issues x 3 years.  No benefit from Astelin.  Normal ear exam today at visit.

## 2020-10-10 ENCOUNTER — Other Ambulatory Visit: Payer: Self-pay | Admitting: Nurse Practitioner

## 2020-10-10 DIAGNOSIS — E039 Hypothyroidism, unspecified: Secondary | ICD-10-CM

## 2020-10-10 LAB — COMPREHENSIVE METABOLIC PANEL
ALT: 14 IU/L (ref 0–32)
AST: 16 IU/L (ref 0–40)
Albumin/Globulin Ratio: 1.7 (ref 1.2–2.2)
Albumin: 4 g/dL (ref 3.9–5.0)
Alkaline Phosphatase: 59 IU/L (ref 44–121)
BUN/Creatinine Ratio: 20 (ref 9–23)
BUN: 15 mg/dL (ref 6–20)
Bilirubin Total: 0.2 mg/dL (ref 0.0–1.2)
CO2: 24 mmol/L (ref 20–29)
Calcium: 9.6 mg/dL (ref 8.7–10.2)
Chloride: 103 mmol/L (ref 96–106)
Creatinine, Ser: 0.75 mg/dL (ref 0.57–1.00)
GFR calc Af Amer: 128 mL/min/{1.73_m2} (ref >59)
GFR calc non Af Amer: 111 mL/min/{1.73_m2} (ref >59)
Globulin, Total: 2.4 g/dL (ref 1.5–4.5)
Glucose: 92 mg/dL (ref 65–99)
Potassium: 4.6 mmol/L (ref 3.5–5.2)
Sodium: 138 mmol/L (ref 134–144)
Total Protein: 6.4 g/dL (ref 6.0–8.5)

## 2020-10-10 LAB — CBC WITH DIFFERENTIAL/PLATELET
Basophils Absolute: 0 10*3/uL (ref 0.0–0.2)
Basos: 1 %
EOS (ABSOLUTE): 0.2 10*3/uL (ref 0.0–0.4)
Eos: 2 %
Hematocrit: 36.6 % (ref 34.0–46.6)
Hemoglobin: 12.6 g/dL (ref 11.1–15.9)
Immature Grans (Abs): 0 10*3/uL (ref 0.0–0.1)
Immature Granulocytes: 1 %
Lymphocytes Absolute: 1.9 10*3/uL (ref 0.7–3.1)
Lymphs: 27 %
MCH: 30.2 pg (ref 26.6–33.0)
MCHC: 34.4 g/dL (ref 31.5–35.7)
MCV: 88 fL (ref 79–97)
Monocytes Absolute: 0.7 10*3/uL (ref 0.1–0.9)
Monocytes: 10 %
Neutrophils Absolute: 4.2 10*3/uL (ref 1.4–7.0)
Neutrophils: 59 %
Platelets: 349 10*3/uL (ref 150–450)
RBC: 4.17 x10E6/uL (ref 3.77–5.28)
RDW: 12.4 % (ref 11.7–15.4)
WBC: 7.1 10*3/uL (ref 3.4–10.8)

## 2020-10-10 LAB — LIPID PANEL W/O CHOL/HDL RATIO
Cholesterol, Total: 206 mg/dL — ABNORMAL HIGH (ref 100–199)
HDL: 82 mg/dL (ref >39)
LDL Chol Calc (NIH): 95 mg/dL (ref 0–99)
Triglycerides: 172 mg/dL — ABNORMAL HIGH (ref 0–149)
VLDL Cholesterol Cal: 29 mg/dL (ref 5–40)

## 2020-10-10 LAB — HEPATITIS C ANTIBODY: Hep C Virus Ab: <0.1 s/co ratio (ref 0.0–0.9)

## 2020-10-10 LAB — HIV ANTIBODY (ROUTINE TESTING W REFLEX): HIV Screen 4th Generation wRfx: NONREACTIVE

## 2020-10-10 LAB — TSH: TSH: 4.82 u[IU]/mL — ABNORMAL HIGH (ref 0.450–4.500)

## 2020-10-10 NOTE — Progress Notes (Signed)
Contacted via MyChart  Good morning Brianna Carney your labs have returned.  Overall they are normal with exception of mild elevation in total cholesterol, would focus on healthy diet changes and regular exercise for this.  Also your TSH, thyroid lab, was a little elevated.  At this time I would continue your Levothyroxine dose and would like to see you for lab visit only in 6 weeks to recheck at office, if still elevate then we may adjust.  Could you please schedule lab visit only for 6 weeks, this will mean you come in for lab and then are able to leave.  I will contact you with results when they return.  Any questions? Keep being awesome!!  Thank you for allowing me to participate in your care. Kindest regards, Rishikesh Khachatryan

## 2020-10-11 ENCOUNTER — Telehealth: Payer: Self-pay

## 2020-10-11 LAB — CYTOLOGY - PAP: Diagnosis: NEGATIVE

## 2020-10-11 NOTE — Progress Notes (Signed)
Contacted via MyChart  Good evening Brianna Carney, your pap returned negative.  We will not need to repeat for 3 years.  Great news.  Have a wonderful evening.

## 2020-10-11 NOTE — Telephone Encounter (Signed)
Called pt to change 12/15 appt to a lab only visit no answer vm full

## 2020-10-14 NOTE — Telephone Encounter (Signed)
Called again no answer vm full

## 2020-10-15 DIAGNOSIS — Z713 Dietary counseling and surveillance: Secondary | ICD-10-CM | POA: Diagnosis not present

## 2020-10-16 NOTE — Telephone Encounter (Signed)
Called pt no answer/ vm full  ?

## 2020-10-22 DIAGNOSIS — F603 Borderline personality disorder: Secondary | ICD-10-CM | POA: Diagnosis not present

## 2020-10-24 ENCOUNTER — Other Ambulatory Visit: Payer: Self-pay | Admitting: Ophthalmology

## 2020-10-24 ENCOUNTER — Other Ambulatory Visit (HOSPITAL_COMMUNITY): Payer: Self-pay | Admitting: Ophthalmology

## 2020-10-24 DIAGNOSIS — H4710 Unspecified papilledema: Secondary | ICD-10-CM

## 2020-10-29 DIAGNOSIS — F603 Borderline personality disorder: Secondary | ICD-10-CM | POA: Diagnosis not present

## 2020-11-05 DIAGNOSIS — B372 Candidiasis of skin and nail: Secondary | ICD-10-CM | POA: Diagnosis not present

## 2020-11-05 DIAGNOSIS — F603 Borderline personality disorder: Secondary | ICD-10-CM | POA: Diagnosis not present

## 2020-11-06 ENCOUNTER — Ambulatory Visit: Payer: BC Managed Care – PPO | Admitting: Nurse Practitioner

## 2020-11-07 DIAGNOSIS — H93291 Other abnormal auditory perceptions, right ear: Secondary | ICD-10-CM | POA: Diagnosis not present

## 2020-11-07 DIAGNOSIS — H9313 Tinnitus, bilateral: Secondary | ICD-10-CM | POA: Diagnosis not present

## 2020-11-08 ENCOUNTER — Ambulatory Visit
Admission: RE | Admit: 2020-11-08 | Discharge: 2020-11-08 | Disposition: A | Discharge status: Home / Self Care | Payer: BC Managed Care – PPO | Source: Ambulatory Visit | Attending: Ophthalmology | Admitting: Ophthalmology

## 2020-11-08 ENCOUNTER — Other Ambulatory Visit: Payer: Self-pay

## 2020-11-08 DIAGNOSIS — J341 Cyst and mucocele of nose and nasal sinus: Secondary | ICD-10-CM | POA: Diagnosis not present

## 2020-11-08 DIAGNOSIS — H4710 Unspecified papilledema: Secondary | ICD-10-CM | POA: Diagnosis not present

## 2020-11-08 DIAGNOSIS — J3489 Other specified disorders of nose and nasal sinuses: Secondary | ICD-10-CM | POA: Diagnosis not present

## 2020-11-08 DIAGNOSIS — J320 Chronic maxillary sinusitis: Secondary | ICD-10-CM | POA: Diagnosis not present

## 2020-11-08 IMAGING — MR MR BRAIN/IAC WO/W CM
10 of 14 series · 27 of 48 positions shown · IV contrast (gadavist)
Comparison: None.

CLINICAL DATA: Optic disc edema.

EXAM:
MRI HEAD WITHOUT AND WITH CONTRAST
TECHNIQUE: Multiplanar, multiecho pulse sequences of the brain and surrounding
structures were obtained without and with intravenous contrast.
CONTRAST:  10mL GADAVIST GADOBUTROL 1 MMOL/ML IV SOLN

[Series 5: T1 · sagittal · 5.0mm · 0.62mm/px · 1 of 22 slices shown (1 of 3)]
[im 1/22]
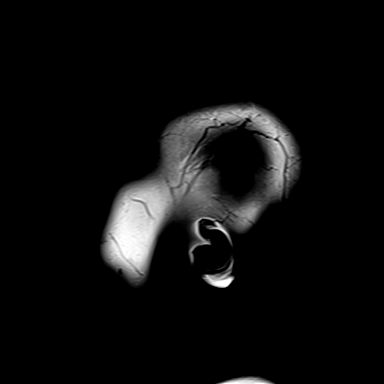

[Series 6: ax dwi_tracew · axial · 3.0mm · 0.60mm/px · z∈[-92,+63]mm · 4 of 48 slices shown]
[im 1/48]
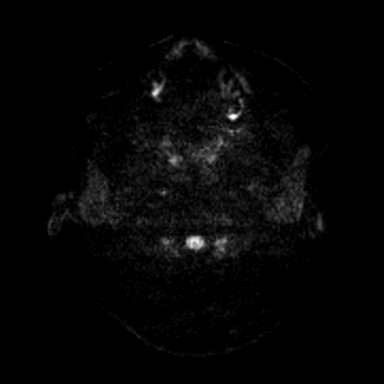
[im 16/48]
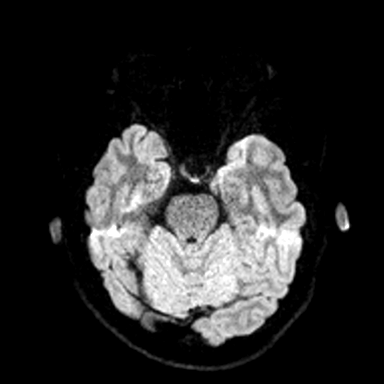
[im 32/48]
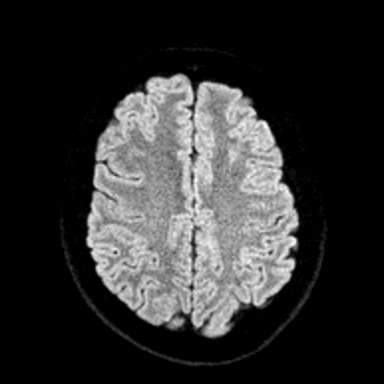
[im 48/48]
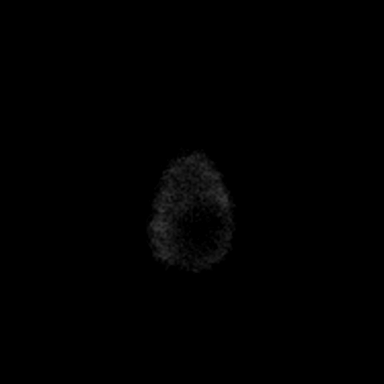

[Series 7: ax dwi_adc · axial · 3.0mm · 0.60mm/px · z∈[-92,+10]mm · 3 of 48 slices shown]
[im 1/48]
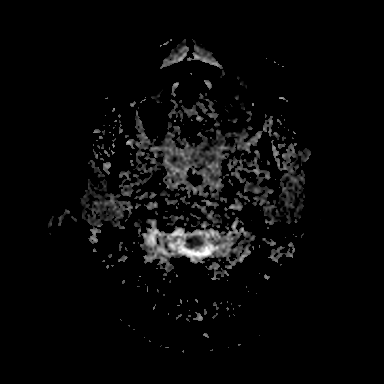
[im 16/48]
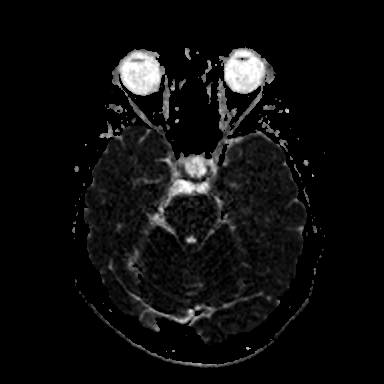
[im 32/48]
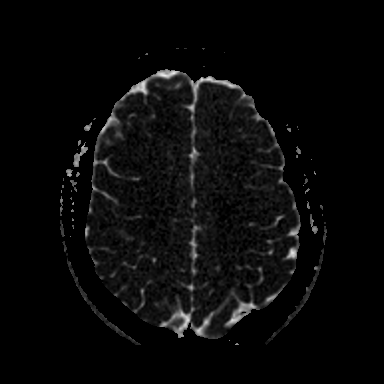

[Series 8: T2 · axial · 5.0mm · 0.53mm/px · z∈[-87,+57]mm · 2 of 25 slices shown]
[im 1/25]
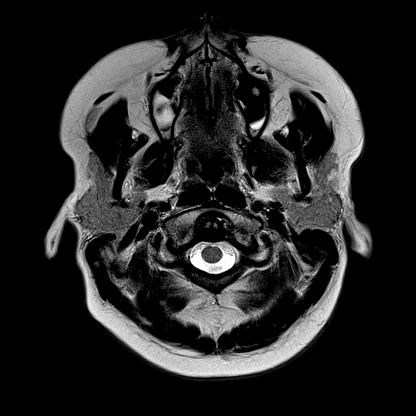
[im 25/25]
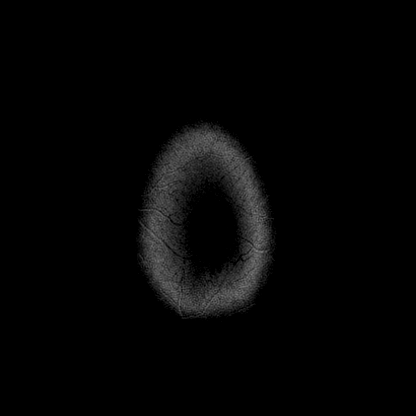

[Series 13: T1 · coronal · non-contrast · 3.0mm · 0.21mm/px · 1 of 13 slices shown (2 of 3)]
[im 1/13]
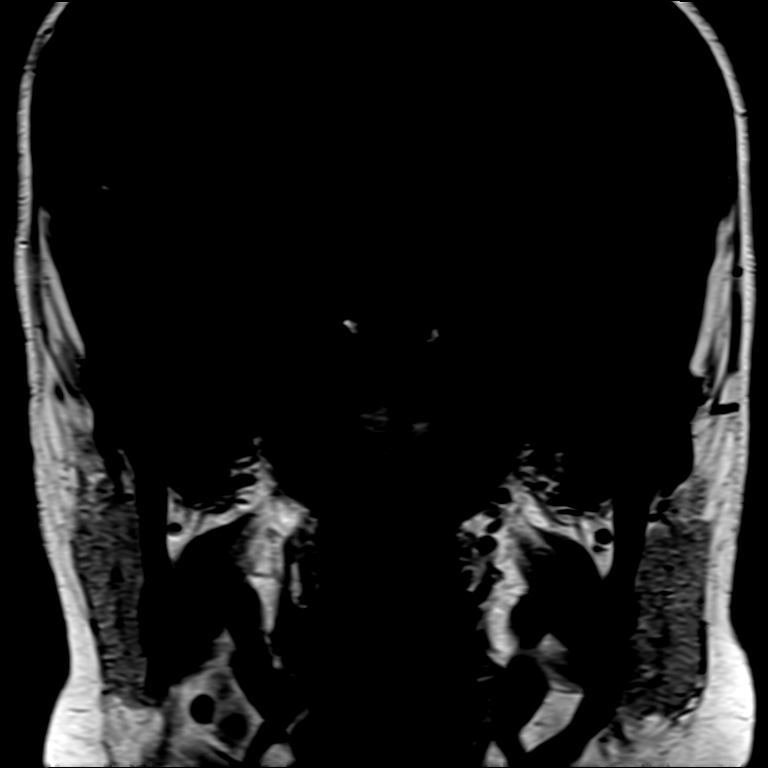

[Series 14: FLAIR · axial · 3.0mm · 0.53mm/px · z∈[-96,+66]mm · 4 of 55 slices shown]
[im 1/55]
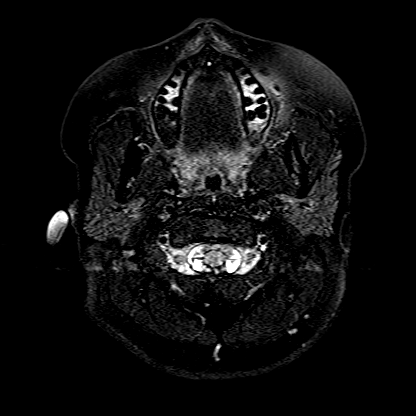
[im 19/55]
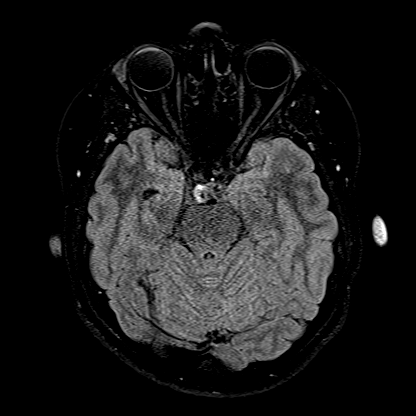
[im 37/55]
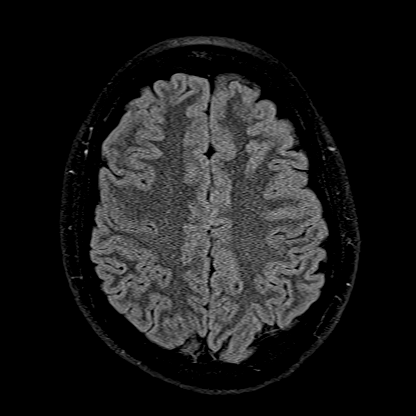
[im 55/55]
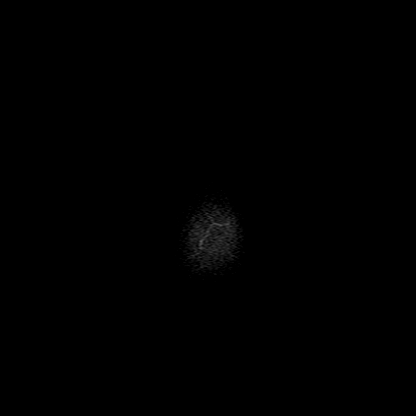

[Series 16: T1 · axial · non-contrast · 3.0mm · 0.21mm/px · 1 of 11 slices shown (3 of 3)]
[im 1/11]
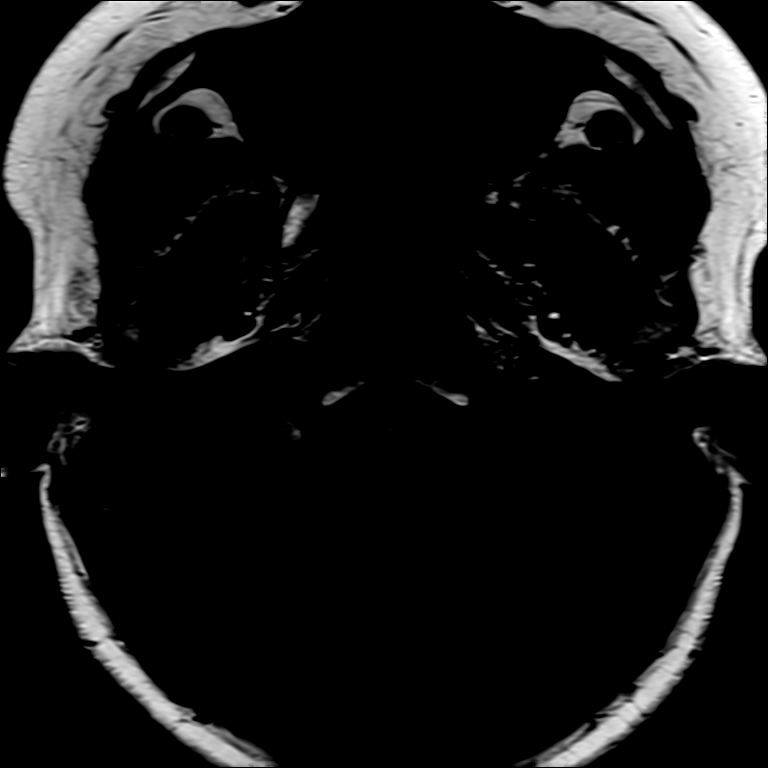

[Series 17: T1 post-contrast · axial · 3.0mm · 0.21mm/px · 1 of 11 slices shown (1 of 3)]
[im 1/11]
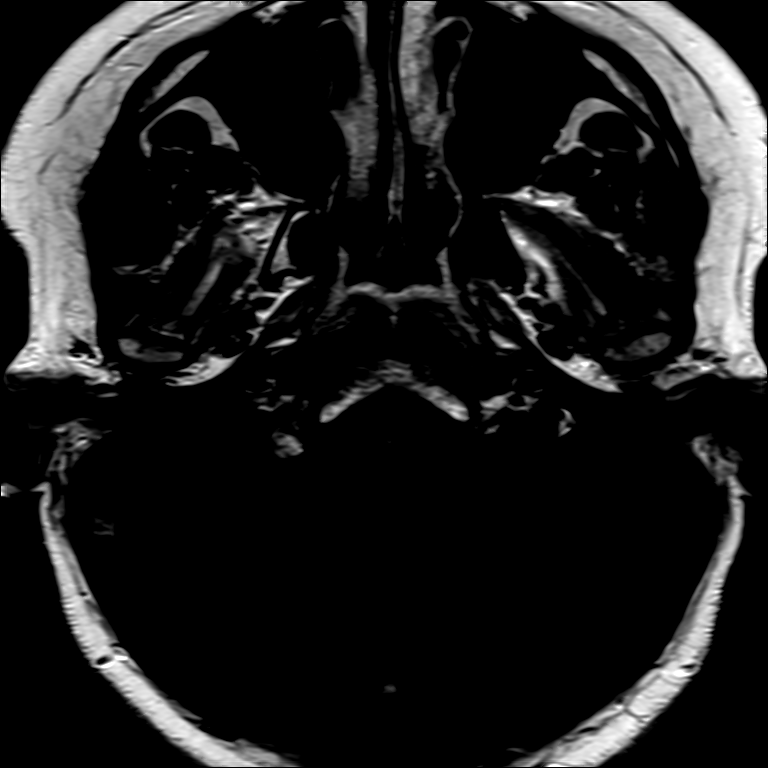

[Series 18: T1 post-contrast · coronal · 3.0mm · 0.21mm/px · 1 of 13 slices shown (2 of 3)]
[im 1/13]
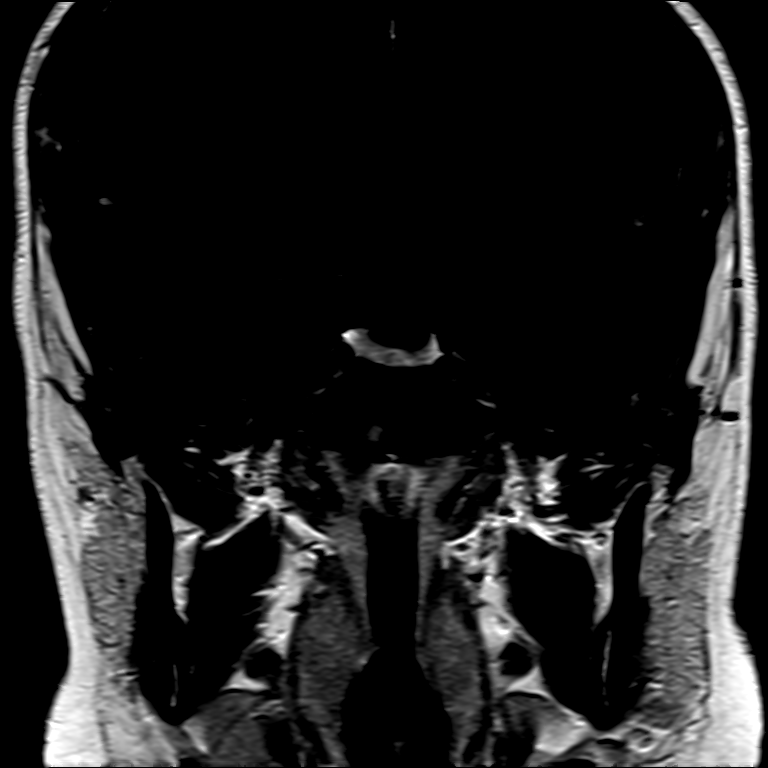

[Series 19: T1 post-contrast · axial · 1.0mm · 0.98mm/px · z∈[-102,+71]mm · 9 of 175 slices shown (3 of 3)]
[im 1/175]
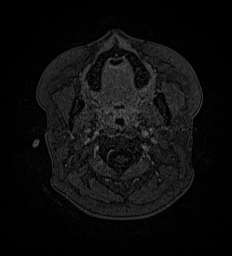
[im 30/175]
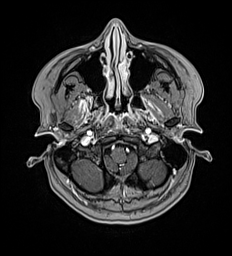
[im 59/175]
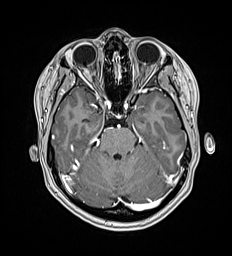
[im 73/175]
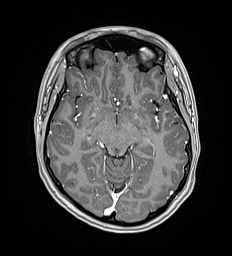
[im 88/175]
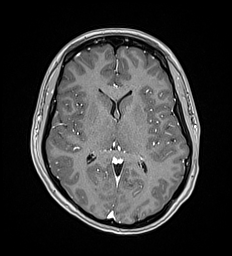
[im 102/175]
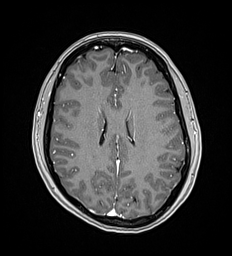
[im 117/175]
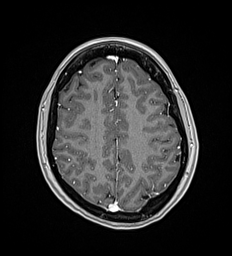
[im 146/175]
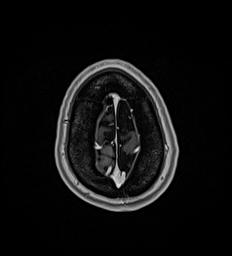
[im 175/175]
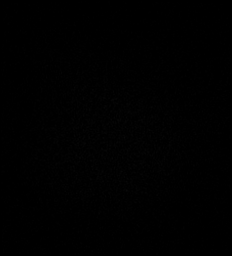

[27 of 48 positions shown; findings below may reference images not displayed]

FINDINGS: Brain: No acute infarction, hemorrhage, hydrocephalus, extra-axial
collection or mass lesion. The brain parenchyma has normal
morphology and signal characteristics.

No cerebellopontine angle mass or internal auditory canal lesion is
demonstrated.Normal appearance of the 7th and 8th cranial nerves
bilaterally.

No focus of abnormal contrast enhancement.

Vascular: Normal flow voids.

Skull and upper cervical spine: Normal marrow signal.

Sinuses/Orbits: Mucous retention cyst in the right maxillary sinus
and mild thickening of the left maxillary sinus.

Other: None.
IMPRESSION: 1. Normal MRI of the brain.
2. No cerebellopontine angle mass or internal auditory canal lesion.
Normal appearance of the 7th and 8th cranial nerves bilaterally.
3. Mild bilateral maxillary sinus disease.

## 2020-11-08 MED ORDER — GADOBUTROL 1 MMOL/ML IV SOLN
10.0000 mL | Freq: Once | INTRAVENOUS | Status: AC | PRN
  Administered 2020-11-08: 10 mL via INTRAVENOUS

## 2020-11-12 DIAGNOSIS — F603 Borderline personality disorder: Secondary | ICD-10-CM | POA: Diagnosis not present

## 2020-11-19 ENCOUNTER — Other Ambulatory Visit: Payer: BC Managed Care – PPO

## 2020-11-19 ENCOUNTER — Other Ambulatory Visit: Payer: Self-pay

## 2020-11-19 DIAGNOSIS — E039 Hypothyroidism, unspecified: Secondary | ICD-10-CM

## 2020-11-19 DIAGNOSIS — F603 Borderline personality disorder: Secondary | ICD-10-CM | POA: Diagnosis not present

## 2020-11-20 ENCOUNTER — Other Ambulatory Visit: Payer: Self-pay | Admitting: Nurse Practitioner

## 2020-11-20 ENCOUNTER — Ambulatory Visit: Payer: BC Managed Care – PPO | Admitting: Nurse Practitioner

## 2020-11-20 DIAGNOSIS — E039 Hypothyroidism, unspecified: Secondary | ICD-10-CM

## 2020-11-20 LAB — TSH: TSH: 4.68 u[IU]/mL — ABNORMAL HIGH (ref 0.450–4.500)

## 2020-11-20 LAB — T4, FREE: Free T4: 1.06 ng/dL (ref 0.82–1.77)

## 2020-11-20 MED ORDER — LEVOTHYROXINE SODIUM 50 MCG PO TABS: 50.0000 ug | ORAL_TABLET | Freq: Every day | ORAL | 3 refills | Status: AC

## 2020-11-20 NOTE — Progress Notes (Signed)
Thyroid recheck

## 2020-11-20 NOTE — Progress Notes (Signed)
Contacted via MyChart -- needs lab only visit in 6 weeks   Good afternoon Brianna Carney, your thyroid labs have returned and TSH remains elevated, Free T4 is normal.  It would be good to adjust your Levothyroxine to 50 MCG daily since TSH did trend up.  I have sent in this prescription for increased dose and would like to recheck labs in 6 weeks, similar to what we did this time with lab only.  Any questions?

## 2020-11-21 DIAGNOSIS — H9311 Tinnitus, right ear: Secondary | ICD-10-CM | POA: Diagnosis not present

## 2020-11-26 DIAGNOSIS — G43119 Migraine with aura, intractable, without status migrainosus: Secondary | ICD-10-CM | POA: Diagnosis not present

## 2020-11-26 DIAGNOSIS — E538 Deficiency of other specified B group vitamins: Secondary | ICD-10-CM | POA: Diagnosis not present

## 2020-11-26 DIAGNOSIS — E559 Vitamin D deficiency, unspecified: Secondary | ICD-10-CM | POA: Diagnosis not present

## 2020-11-26 DIAGNOSIS — H539 Unspecified visual disturbance: Secondary | ICD-10-CM | POA: Diagnosis not present

## 2020-11-26 DIAGNOSIS — G932 Benign intracranial hypertension: Secondary | ICD-10-CM | POA: Diagnosis not present

## 2020-11-26 DIAGNOSIS — F603 Borderline personality disorder: Secondary | ICD-10-CM | POA: Diagnosis not present

## 2020-11-26 DIAGNOSIS — H93A3 Pulsatile tinnitus, bilateral: Secondary | ICD-10-CM | POA: Diagnosis not present

## 2020-11-27 DIAGNOSIS — G932 Benign intracranial hypertension: Secondary | ICD-10-CM | POA: Insufficient documentation

## 2020-12-10 DIAGNOSIS — F603 Borderline personality disorder: Secondary | ICD-10-CM | POA: Diagnosis not present

## 2020-12-17 DIAGNOSIS — F603 Borderline personality disorder: Secondary | ICD-10-CM | POA: Diagnosis not present

## 2020-12-19 DIAGNOSIS — F3341 Major depressive disorder, recurrent, in partial remission: Secondary | ICD-10-CM | POA: Diagnosis not present

## 2020-12-24 DIAGNOSIS — F603 Borderline personality disorder: Secondary | ICD-10-CM | POA: Diagnosis not present

## 2020-12-31 DIAGNOSIS — F603 Borderline personality disorder: Secondary | ICD-10-CM | POA: Diagnosis not present

## 2021-01-04 ENCOUNTER — Encounter: Payer: Self-pay | Admitting: Nurse Practitioner

## 2021-01-06 ENCOUNTER — Other Ambulatory Visit: Payer: Self-pay | Admitting: Neurology

## 2021-01-06 DIAGNOSIS — H93A3 Pulsatile tinnitus, bilateral: Secondary | ICD-10-CM

## 2021-01-06 DIAGNOSIS — H539 Unspecified visual disturbance: Secondary | ICD-10-CM

## 2021-01-06 DIAGNOSIS — G932 Benign intracranial hypertension: Secondary | ICD-10-CM

## 2021-01-07 ENCOUNTER — Ambulatory Visit: Payer: BC Managed Care – PPO | Admitting: Nurse Practitioner

## 2021-01-07 DIAGNOSIS — F603 Borderline personality disorder: Secondary | ICD-10-CM | POA: Diagnosis not present

## 2021-01-08 ENCOUNTER — Other Ambulatory Visit: Payer: Self-pay

## 2021-01-08 ENCOUNTER — Other Ambulatory Visit: Payer: BC Managed Care – PPO

## 2021-01-08 DIAGNOSIS — E039 Hypothyroidism, unspecified: Secondary | ICD-10-CM | POA: Diagnosis not present

## 2021-01-09 LAB — TSH: TSH: 3.36 u[IU]/mL (ref 0.450–4.500)

## 2021-01-09 LAB — T4, FREE: Free T4: 1.21 ng/dL (ref 0.82–1.77)

## 2021-01-09 NOTE — Progress Notes (Signed)
Contacted via MyChart   Good morning Brianna Carney, your thyroid labs have returned and are at normal level with Levothyroxine now at 50 MCG, much improved.  I would continue this dosing at this time and follow-up in 6 months as scheduled.  Any questions? Keep being amazing!  Thank you for allowing me to participate in your care. Kindest regards, Jadore Veals

## 2021-01-14 DIAGNOSIS — F603 Borderline personality disorder: Secondary | ICD-10-CM | POA: Diagnosis not present

## 2021-01-14 DIAGNOSIS — Z713 Dietary counseling and surveillance: Secondary | ICD-10-CM | POA: Diagnosis not present

## 2021-01-16 ENCOUNTER — Ambulatory Visit
Admission: RE | Admit: 2021-01-16 | Discharge: 2021-01-16 | Disposition: A | Discharge status: Home / Self Care | Payer: BC Managed Care – PPO | Source: Ambulatory Visit | Attending: Neurology | Admitting: Neurology

## 2021-01-16 ENCOUNTER — Other Ambulatory Visit: Payer: Self-pay

## 2021-01-16 DIAGNOSIS — G932 Benign intracranial hypertension: Secondary | ICD-10-CM

## 2021-01-16 DIAGNOSIS — H93A9 Pulsatile tinnitus, unspecified ear: Secondary | ICD-10-CM | POA: Diagnosis not present

## 2021-01-16 DIAGNOSIS — H539 Unspecified visual disturbance: Secondary | ICD-10-CM | POA: Diagnosis not present

## 2021-01-16 DIAGNOSIS — H93A3 Pulsatile tinnitus, bilateral: Secondary | ICD-10-CM | POA: Diagnosis not present

## 2021-01-16 DIAGNOSIS — J3489 Other specified disorders of nose and nasal sinuses: Secondary | ICD-10-CM | POA: Diagnosis not present

## 2021-01-16 IMAGING — MR MR MRV HEAD WO/W CM
2 series · 48 of 48 positions shown · IV contrast (gadavist)
Comparison: Previous brain MRI from [DATE].

CLINICAL DATA: Initial evaluation for pulsatile tinnitus.

EXAM:
MR VENOGRAM HEAD WITHOUT AND WITH CONTRAST
TECHNIQUE: Angiographic images of the intracranial venous structures were
obtained using MRV technique without and with intravenous contrast.
CONTRAST:  10mL GADAVIST GADOBUTROL 1 MMOL/ML IV SOLN

[Series 5: tof_2d_paracor · coronal · 2.5mm · 0.98mm/px · 19 of 128 slices shown]
[im 1/128]
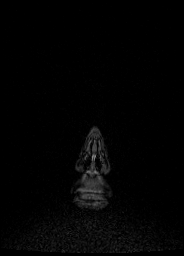
[im 8/128]
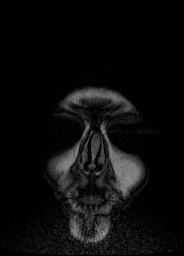
[im 15/128]
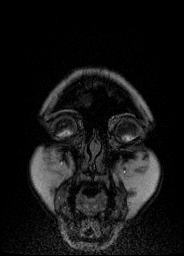
[im 22/128]
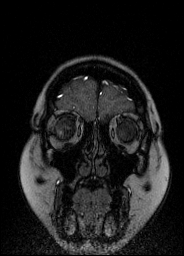
[im 29/128]
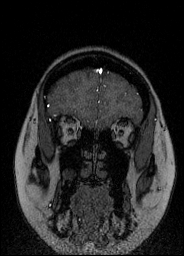
[im 36/128]
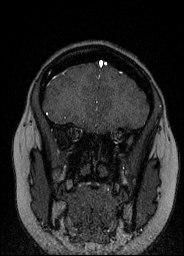
[im 43/128]
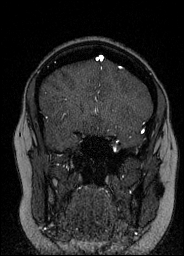
[im 50/128]
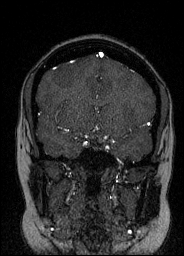
[im 57/128]
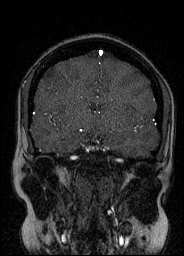
[im 64/128]
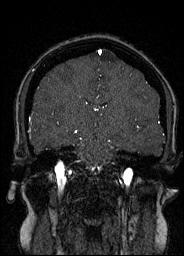
[im 71/128]
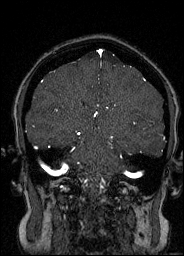
[im 78/128]
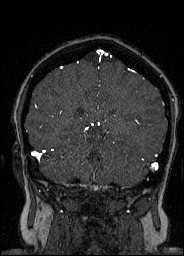
[im 85/128]
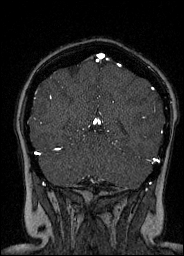
[im 92/128]
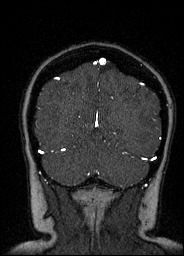
[im 99/128]
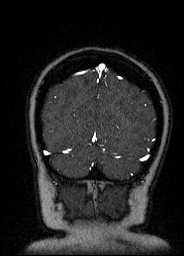
[im 106/128]
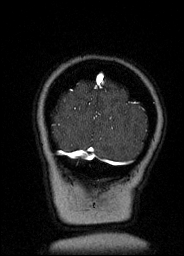
[im 113/128]
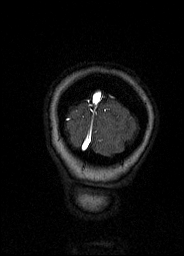
[im 120/128]
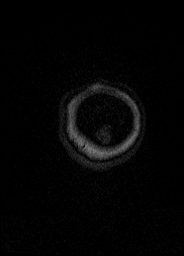
[im 128/128]
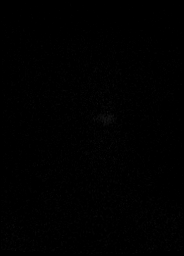

[Series 9: t1_mprage_sag_p2_iso · sagittal · 1.0mm · 0.98mm/px · 29 of 192 slices shown]
[im 1/192]
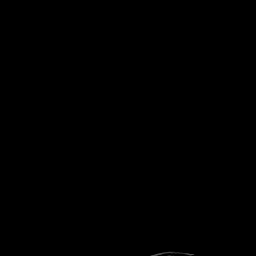
[im 7/192]
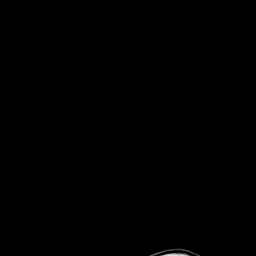
[im 14/192]
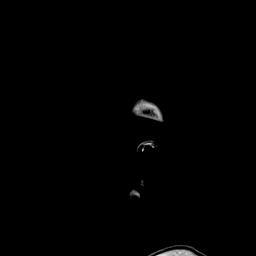
[im 21/192]
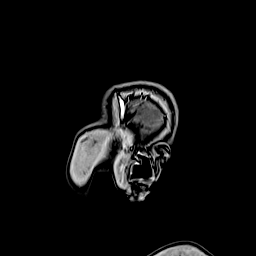
[im 28/192]
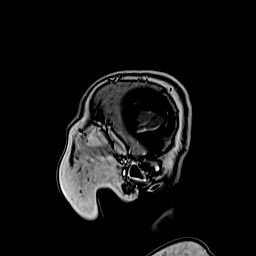
[im 35/192]
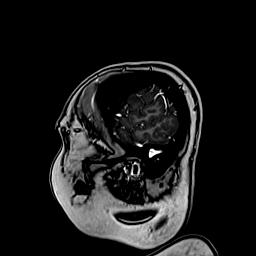
[im 41/192]
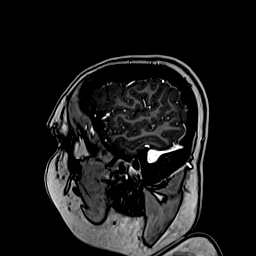
[im 48/192]
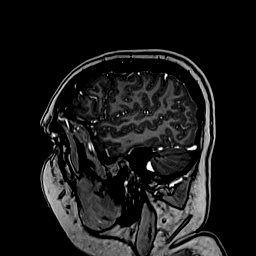
[im 55/192]
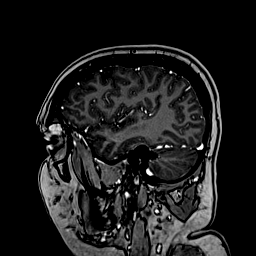
[im 62/192]
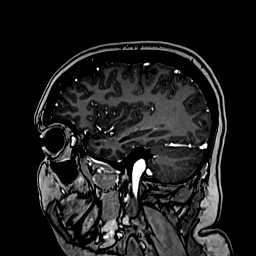
[im 69/192]
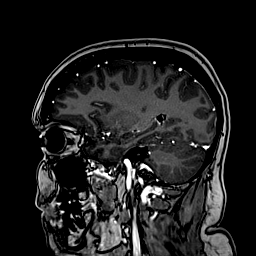
[im 76/192]
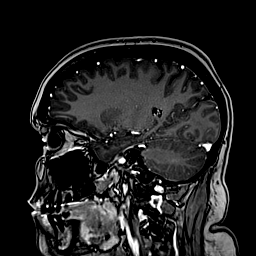
[im 82/192]
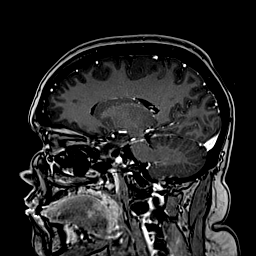
[im 89/192]
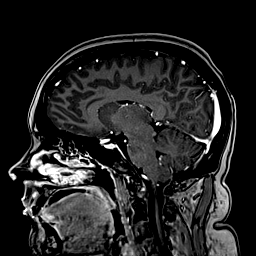
[im 96/192]
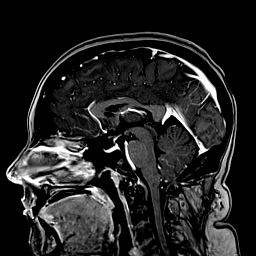
[im 103/192]
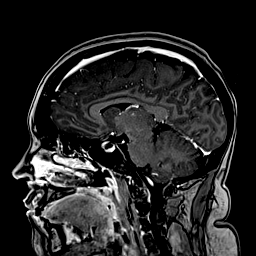
[im 110/192]
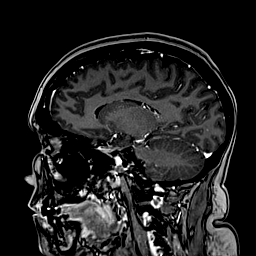
[im 116/192]
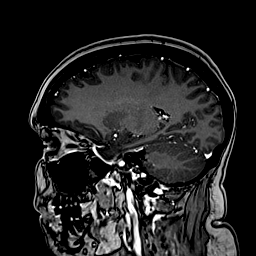
[im 123/192]
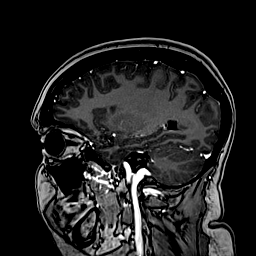
[im 130/192]
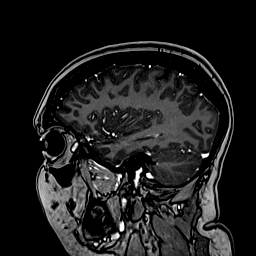
[im 137/192]
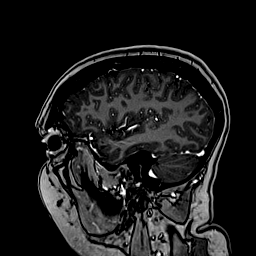
[im 144/192]
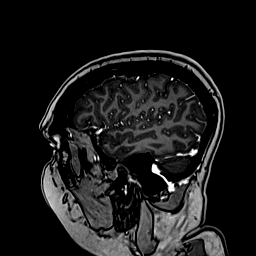
[im 151/192]
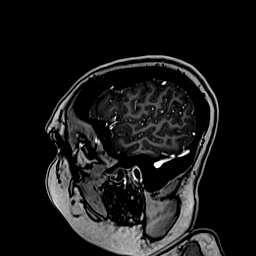
[im 157/192]
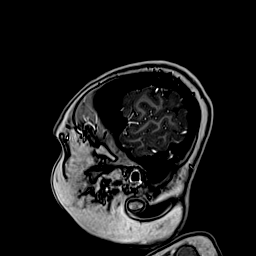
[im 164/192]
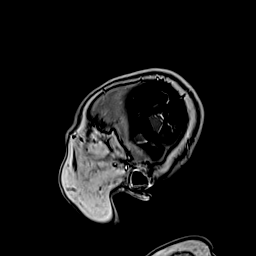
[im 171/192]
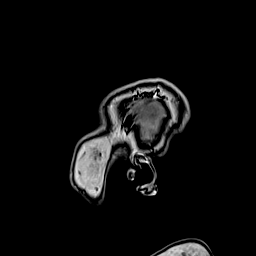
[im 178/192]
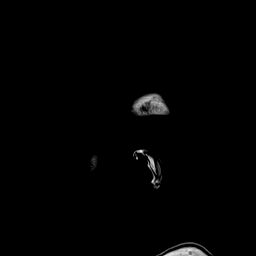
[im 185/192]
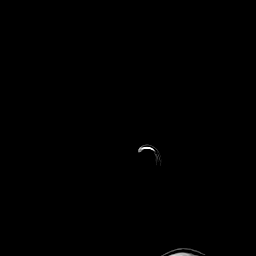
[im 192/192]
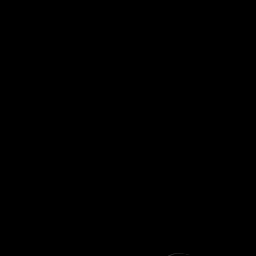

[48 of 48 positions shown; findings below may reference images not displayed]

FINDINGS: Normal flow related signal and enhancement seen throughout the
superior sagittal sinus to the level of the torcula. Torcula itself
is patent. Transverse and sigmoid sinuses are patent as are the
visualized proximal internal jugular veins. Right transverse sinus
is dominant. Straight sinus, vein of ANSELM, internal cerebral veins,
and dominant right basal vein of ANSELM are patent. No
appreciable cortical vein thrombosis. No evidence for dural sinus
thrombosis or stenosis. No appreciable high-riding jugular bulb or
jugular diverticulum. No other pathologic enhancement seen elsewhere
within the brain.
IMPRESSION: Normal intracranial MRV. No findings to explain patient's symptoms
identified.

## 2021-01-16 MED ORDER — GADOBUTROL 1 MMOL/ML IV SOLN
10.0000 mL | Freq: Once | INTRAVENOUS | Status: AC | PRN
  Administered 2021-01-16: 10 mL via INTRAVENOUS

## 2021-01-21 DIAGNOSIS — F603 Borderline personality disorder: Secondary | ICD-10-CM | POA: Diagnosis not present

## 2021-01-28 DIAGNOSIS — F603 Borderline personality disorder: Secondary | ICD-10-CM | POA: Diagnosis not present

## 2021-01-28 DIAGNOSIS — Z713 Dietary counseling and surveillance: Secondary | ICD-10-CM | POA: Diagnosis not present

## 2021-02-04 DIAGNOSIS — F603 Borderline personality disorder: Secondary | ICD-10-CM | POA: Diagnosis not present

## 2021-02-05 ENCOUNTER — Other Ambulatory Visit: Payer: Self-pay | Admitting: Neurology

## 2021-02-05 DIAGNOSIS — G932 Benign intracranial hypertension: Secondary | ICD-10-CM

## 2021-02-11 DIAGNOSIS — Z713 Dietary counseling and surveillance: Secondary | ICD-10-CM | POA: Diagnosis not present

## 2021-02-11 DIAGNOSIS — F603 Borderline personality disorder: Secondary | ICD-10-CM | POA: Diagnosis not present

## 2021-02-13 ENCOUNTER — Ambulatory Visit
Admission: RE | Admit: 2021-02-13 | Discharge: 2021-02-13 | Disposition: A | Discharge status: Home / Self Care | Payer: BC Managed Care – PPO | Source: Ambulatory Visit | Attending: Neurology | Admitting: Neurology

## 2021-02-13 ENCOUNTER — Other Ambulatory Visit: Payer: Self-pay

## 2021-02-13 DIAGNOSIS — G932 Benign intracranial hypertension: Secondary | ICD-10-CM | POA: Diagnosis not present

## 2021-02-13 IMAGING — RF DG FLUORO GUIDE SPINAL/SI JT INJ*L*
1 series · 1 of 1 positions shown · non-contrast
Comparison: None

CLINICAL DATA: Idiopathic intracranial hypertension

EXAM:
DIAGNOSTIC LUMBAR PUNCTURE UNDER FLUOROSCOPIC GUIDANCE

[Series 1: cp_standard · 0.17mm/px · 1 of 1 slices shown]
[im 1/1]
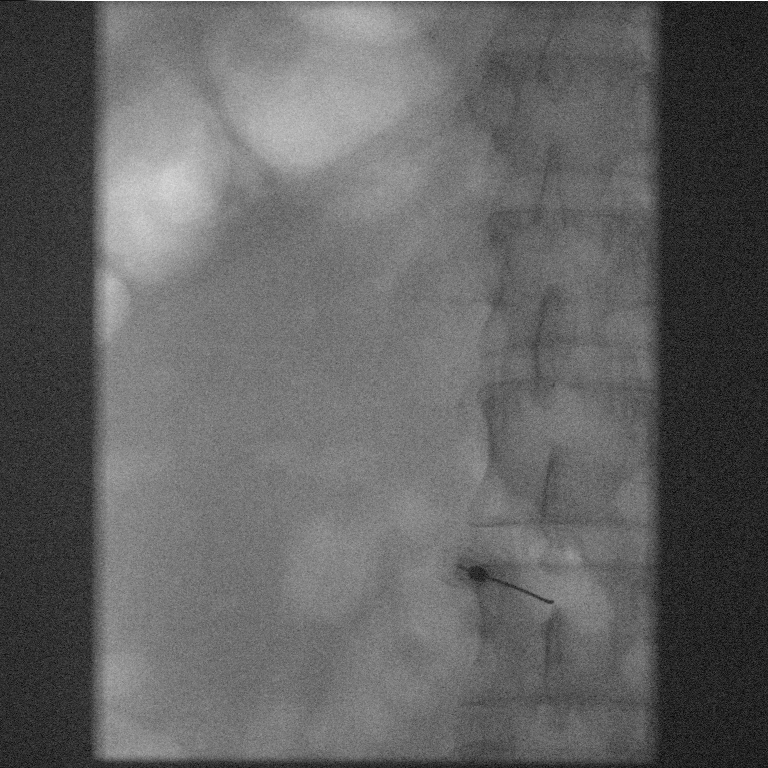

[1 of 1 positions shown; findings below may reference images not displayed]

FLUOROSCOPY TIME:  Fluoroscopy Time:  18 seconds

Radiation Exposure Index (if provided by the fluoroscopic device):
2.9 mGy

PROCEDURE:
Informed consent was obtained from the patient prior to the
procedure, including potential complications of headache, allergy,
and pain. With the patient prone, the lower back was prepped with
Betadine. 1% Lidocaine was used for local anesthesia. Lumbar
puncture was performed at the L2-L3 level using a 22 gauge needle
with return of clear CSF. Opening pressure of 24 cm water. Closing
pressure of 17 cm water after removal of approximately 10 mL of CSF
(high volume removal was not requested). The patient tolerated the
procedure well and there were no apparent complications.
IMPRESSION: Technically successful fluoroscopic guided lumbar puncture.

Opening pressure 24 cm water. Closing pressure 17 cm water after
removal of approximately 10 mL CSF.

## 2021-02-13 MED ORDER — LIDOCAINE HCL (PF) 1 % IJ SOLN
10.0000 mL | Freq: Once | INTRAMUSCULAR | Status: AC
  Administered 2021-02-13: 10 mL
  Filled 2021-02-13: qty 10

## 2021-02-13 MED ORDER — ACETAMINOPHEN 325 MG PO TABS
650.0000 mg | ORAL_TABLET | ORAL | Status: DC | PRN
  Filled 2021-02-13: qty 2

## 2021-02-13 NOTE — Discharge Instructions (Signed)
Lumbar Puncture, Care After This sheet gives you information about how to care for yourself after your procedure. Your health care provider may also give you more specific instructions. If you have problems or questions, contact your health care provider. What can I expect after the procedure? After the procedure, it is common to have:  Mild discomfort or pain at the puncture site.  A mild headache that is relieved with pain medicines. Follow these instructions at home: Activity  Lie down flat or rest for as long as directed by your health care provider.  Return to your normal activities as told by your health care provider. Ask your health care provider what activities are safe for you.  Avoid lifting anything heavier than 10 lb (4.5 kg) for at least 12 hours after the procedure.  Do not drive for 24 hours if you were given a medicine to help you relax (sedative) during your procedure.  Do not drive or use heavy machinery while taking prescription pain medicine.   Puncture site care  Remove or change your bandage (dressing) as told by your health care provider.  Check your puncture area every day for signs of infection. Check for: ? More pain. ? Redness or swelling. ? Fluid or blood leaking from the puncture site. ? Warmth. ? Pus or a bad smell. General instructions  Take over-the-counter and prescription medicines only as told by your health care provider.  Drink enough fluids to keep your urine clear or pale yellow. Your health care provider may recommend drinking caffeine to prevent a headache.  Keep all follow-up visits as told by your health care provider. This is important. Contact a health care provider if:  You have fever or chills.  You have nausea or vomiting.  You have a headache that lasts for more than 2 days or does not get better with medicine. Get help right away if:  You develop any of the following in your  legs: ? Weakness. ? Numbness. ? Tingling.  You are unable to control when you urinate or have a bowel movement (incontinence).  You have signs of infection around your puncture site, such as: ? More pain. ? Redness or swelling. ? Fluid or blood leakage. ? Warmth. ? Pus or a bad smell.  You are dizzy or you feel like you might faint.  You have a severe headache, especially when you sit or stand. Summary  A lumbar puncture is a procedure in which a small needle is inserted into the lower back to remove fluid that surrounds the brain and spinal cord.  After this procedure, it is common to have a headache and pain around the needle insertion area.  Lying flat, staying hydrated, and drinking caffeine can help prevent headaches.  Monitor your needle insertion site for signs of infection, including warmth, fluid, or more pain.  Get help right away if you develop leg weakness, leg numbness, incontinence, or severe headaches. This information is not intended to replace advice given to you by your health care provider. Make sure you discuss any questions you have with your health care provider. Document Revised: 10/03/2020 Document Reviewed: 10/03/2020 Elsevier Patient Education  2021 [employer]  

## 2021-02-14 ENCOUNTER — Ambulatory Visit: Payer: Self-pay | Admitting: *Deleted

## 2021-02-14 NOTE — Telephone Encounter (Signed)
Lvm to call us back to relay message from PCP

## 2021-02-14 NOTE — Telephone Encounter (Signed)
I would recommend since she had a spinal tap yesterday that she go to facility where this took place ASAP for further evaluation, into ER setting.  Most likely spinal headache, but may need some medication to help with symptoms for short period and benefit from assessment and labs.

## 2021-02-14 NOTE — Telephone Encounter (Signed)
  Patient's mother called to report patient is having symptoms of headache and vomiting x 2 after spinal tap yesterday. Denies fever, chest pain, difficulty breathing. Pain reported in shoulders. Patient is able to eat and drink . Encouraged patient 's mother to have patient lay flat and move slow when getting out of bed to the bathroom. Rest today and stay hydrated. Patient's mother reports patient is in [place] now and if appt is necessary will have to drive from [facility].  Patient's mother requesting a call back from PCP. Care advise given. Patient's mother verbalized understanding of care advise and to call back of symptoms worsen.   Reason for Disposition . [1] Headache AND [2] after spinal (epidural) anesthesia AND [3] not severe  Answer Assessment - Initial Assessment Questions 1. SYMPTOM: "What's the main symptom you're concerned about?" (e.g., pain, fever, vomiting)     Vomiting and headache 2. ONSET: "When did symptoms  start?"     Today  3. SURGERY: "What surgery was performed?"     Spinal tap was yesterday.  4. DATE of SURGERY: "When was surgery performed?"      02/13/21 5. ANESTHESIA: " What type of anesthesia did you have?" (e.g., general, spinal, epidural, local)     na 6. PAIN: "Is there any pain?" If Yes, ask: "How bad is it?"  (Scale 1-10; or mild, moderate, severe)     no 7. FEVER: "Do you have a fever?" If Yes, ask: "What is your temperature, how was it measured, and when did it start?"     no 8. VOMITING: "Is there any vomiting?" If yes, ask: "How many times?"     Yes x 2  9. BLEEDING: "Is there any bleeding?" If Yes, ask: "How much?" and "Where?"     na 10. OTHER SYMPTOMS: "Do you have any other symptoms?" (e.g., drainage from wound, painful urination, constipation)       headache  Protocols used: POST-OP SYMPTOMS AND QUESTIONS-A-AH

## 2021-02-15 DIAGNOSIS — R519 Headache, unspecified: Secondary | ICD-10-CM | POA: Diagnosis not present

## 2021-02-15 DIAGNOSIS — Z79899 Other long term (current) drug therapy: Secondary | ICD-10-CM | POA: Diagnosis not present

## 2021-02-15 DIAGNOSIS — Z5181 Encounter for therapeutic drug level monitoring: Secondary | ICD-10-CM | POA: Diagnosis not present

## 2021-02-15 DIAGNOSIS — R110 Nausea: Secondary | ICD-10-CM | POA: Diagnosis not present

## 2021-02-15 DIAGNOSIS — G971 Other reaction to spinal and lumbar puncture: Secondary | ICD-10-CM | POA: Diagnosis not present

## 2021-02-15 DIAGNOSIS — Z20822 Contact with and (suspected) exposure to covid-19: Secondary | ICD-10-CM | POA: Diagnosis not present

## 2021-02-15 DIAGNOSIS — G932 Benign intracranial hypertension: Secondary | ICD-10-CM | POA: Diagnosis not present

## 2021-02-18 DIAGNOSIS — F603 Borderline personality disorder: Secondary | ICD-10-CM | POA: Diagnosis not present

## 2021-02-24 DIAGNOSIS — T1591XA Foreign body on external eye, part unspecified, right eye, initial encounter: Secondary | ICD-10-CM | POA: Diagnosis not present

## 2021-02-26 DIAGNOSIS — Z713 Dietary counseling and surveillance: Secondary | ICD-10-CM | POA: Diagnosis not present

## 2021-02-27 DIAGNOSIS — F603 Borderline personality disorder: Secondary | ICD-10-CM | POA: Diagnosis not present

## 2021-03-06 DIAGNOSIS — F603 Borderline personality disorder: Secondary | ICD-10-CM | POA: Diagnosis not present

## 2021-03-12 DIAGNOSIS — Z713 Dietary counseling and surveillance: Secondary | ICD-10-CM | POA: Diagnosis not present

## 2021-03-13 DIAGNOSIS — F603 Borderline personality disorder: Secondary | ICD-10-CM | POA: Diagnosis not present

## 2021-03-14 DIAGNOSIS — Z5181 Encounter for therapeutic drug level monitoring: Secondary | ICD-10-CM | POA: Diagnosis not present

## 2021-03-20 DIAGNOSIS — F603 Borderline personality disorder: Secondary | ICD-10-CM | POA: Diagnosis not present

## 2021-03-26 DIAGNOSIS — Z713 Dietary counseling and surveillance: Secondary | ICD-10-CM | POA: Diagnosis not present

## 2021-03-27 DIAGNOSIS — F603 Borderline personality disorder: Secondary | ICD-10-CM | POA: Diagnosis not present

## 2021-03-31 DIAGNOSIS — E282 Polycystic ovarian syndrome: Secondary | ICD-10-CM | POA: Diagnosis not present

## 2021-03-31 DIAGNOSIS — J45990 Exercise induced bronchospasm: Secondary | ICD-10-CM | POA: Diagnosis not present

## 2021-04-07 DIAGNOSIS — F39 Unspecified mood [affective] disorder: Secondary | ICD-10-CM | POA: Diagnosis not present

## 2021-04-10 DIAGNOSIS — F603 Borderline personality disorder: Secondary | ICD-10-CM | POA: Diagnosis not present

## 2021-04-14 DIAGNOSIS — E282 Polycystic ovarian syndrome: Secondary | ICD-10-CM | POA: Diagnosis not present

## 2021-04-17 DIAGNOSIS — F603 Borderline personality disorder: Secondary | ICD-10-CM | POA: Diagnosis not present

## 2021-04-23 DIAGNOSIS — Z713 Dietary counseling and surveillance: Secondary | ICD-10-CM | POA: Diagnosis not present

## 2021-04-24 DIAGNOSIS — F603 Borderline personality disorder: Secondary | ICD-10-CM | POA: Diagnosis not present

## 2021-04-29 DIAGNOSIS — G932 Benign intracranial hypertension: Secondary | ICD-10-CM | POA: Diagnosis not present

## 2021-04-29 DIAGNOSIS — R7981 Abnormal blood-gas level: Secondary | ICD-10-CM | POA: Diagnosis not present

## 2021-05-06 DIAGNOSIS — H4711 Papilledema associated with increased intracranial pressure: Secondary | ICD-10-CM | POA: Diagnosis not present

## 2021-05-07 DIAGNOSIS — Z713 Dietary counseling and surveillance: Secondary | ICD-10-CM | POA: Diagnosis not present

## 2021-05-08 DIAGNOSIS — F603 Borderline personality disorder: Secondary | ICD-10-CM | POA: Diagnosis not present

## 2021-05-15 DIAGNOSIS — F603 Borderline personality disorder: Secondary | ICD-10-CM | POA: Diagnosis not present

## 2021-05-21 DIAGNOSIS — Z713 Dietary counseling and surveillance: Secondary | ICD-10-CM | POA: Diagnosis not present

## 2021-05-23 DIAGNOSIS — F603 Borderline personality disorder: Secondary | ICD-10-CM | POA: Diagnosis not present

## 2021-06-04 DIAGNOSIS — Z713 Dietary counseling and surveillance: Secondary | ICD-10-CM | POA: Diagnosis not present

## 2021-06-05 DIAGNOSIS — F603 Borderline personality disorder: Secondary | ICD-10-CM | POA: Diagnosis not present

## 2021-06-12 DIAGNOSIS — F603 Borderline personality disorder: Secondary | ICD-10-CM | POA: Diagnosis not present

## 2021-06-18 DIAGNOSIS — Z713 Dietary counseling and surveillance: Secondary | ICD-10-CM | POA: Diagnosis not present

## 2021-06-19 DIAGNOSIS — F603 Borderline personality disorder: Secondary | ICD-10-CM | POA: Diagnosis not present

## 2021-06-26 DIAGNOSIS — F603 Borderline personality disorder: Secondary | ICD-10-CM | POA: Diagnosis not present

## 2021-07-02 DIAGNOSIS — Z713 Dietary counseling and surveillance: Secondary | ICD-10-CM | POA: Diagnosis not present

## 2021-07-03 DIAGNOSIS — F603 Borderline personality disorder: Secondary | ICD-10-CM | POA: Diagnosis not present

## 2021-07-08 DIAGNOSIS — F3341 Major depressive disorder, recurrent, in partial remission: Secondary | ICD-10-CM | POA: Diagnosis not present

## 2021-07-10 DIAGNOSIS — F603 Borderline personality disorder: Secondary | ICD-10-CM | POA: Diagnosis not present

## 2021-07-17 DIAGNOSIS — F603 Borderline personality disorder: Secondary | ICD-10-CM | POA: Diagnosis not present

## 2021-07-24 DIAGNOSIS — F603 Borderline personality disorder: Secondary | ICD-10-CM | POA: Diagnosis not present

## 2021-07-28 DIAGNOSIS — G932 Benign intracranial hypertension: Secondary | ICD-10-CM | POA: Diagnosis not present

## 2021-07-28 DIAGNOSIS — E872 Acidosis: Secondary | ICD-10-CM | POA: Diagnosis not present

## 2021-07-30 DIAGNOSIS — Z713 Dietary counseling and surveillance: Secondary | ICD-10-CM | POA: Diagnosis not present

## 2021-08-04 DIAGNOSIS — F332 Major depressive disorder, recurrent severe without psychotic features: Secondary | ICD-10-CM | POA: Diagnosis not present

## 2021-08-06 DIAGNOSIS — F603 Borderline personality disorder: Secondary | ICD-10-CM | POA: Diagnosis not present

## 2021-08-13 DIAGNOSIS — Z713 Dietary counseling and surveillance: Secondary | ICD-10-CM | POA: Diagnosis not present

## 2021-08-18 DIAGNOSIS — F603 Borderline personality disorder: Secondary | ICD-10-CM | POA: Diagnosis not present

## 2021-08-19 DIAGNOSIS — G932 Benign intracranial hypertension: Secondary | ICD-10-CM | POA: Diagnosis not present

## 2021-08-25 DIAGNOSIS — F603 Borderline personality disorder: Secondary | ICD-10-CM | POA: Diagnosis not present

## 2021-08-27 DIAGNOSIS — Z713 Dietary counseling and surveillance: Secondary | ICD-10-CM | POA: Diagnosis not present

## 2021-09-01 DIAGNOSIS — F603 Borderline personality disorder: Secondary | ICD-10-CM | POA: Diagnosis not present

## 2021-09-08 DIAGNOSIS — F603 Borderline personality disorder: Secondary | ICD-10-CM | POA: Diagnosis not present

## 2021-09-10 DIAGNOSIS — Z713 Dietary counseling and surveillance: Secondary | ICD-10-CM | POA: Diagnosis not present

## 2021-09-15 DIAGNOSIS — F603 Borderline personality disorder: Secondary | ICD-10-CM | POA: Diagnosis not present

## 2021-09-16 DIAGNOSIS — Z0000 Encounter for general adult medical examination without abnormal findings: Secondary | ICD-10-CM | POA: Diagnosis not present

## 2021-09-16 DIAGNOSIS — Z23 Encounter for immunization: Secondary | ICD-10-CM | POA: Diagnosis not present

## 2021-09-16 DIAGNOSIS — E039 Hypothyroidism, unspecified: Secondary | ICD-10-CM | POA: Diagnosis not present

## 2021-09-22 DIAGNOSIS — F603 Borderline personality disorder: Secondary | ICD-10-CM | POA: Diagnosis not present

## 2021-09-24 DIAGNOSIS — Z713 Dietary counseling and surveillance: Secondary | ICD-10-CM | POA: Diagnosis not present

## 2021-09-29 DIAGNOSIS — F603 Borderline personality disorder: Secondary | ICD-10-CM | POA: Diagnosis not present

## 2021-10-13 DIAGNOSIS — F603 Borderline personality disorder: Secondary | ICD-10-CM | POA: Diagnosis not present

## 2021-10-15 DIAGNOSIS — Z713 Dietary counseling and surveillance: Secondary | ICD-10-CM | POA: Diagnosis not present

## 2021-10-20 DIAGNOSIS — F603 Borderline personality disorder: Secondary | ICD-10-CM | POA: Diagnosis not present

## 2021-10-22 DIAGNOSIS — Z713 Dietary counseling and surveillance: Secondary | ICD-10-CM | POA: Diagnosis not present

## 2021-10-27 DIAGNOSIS — F603 Borderline personality disorder: Secondary | ICD-10-CM | POA: Diagnosis not present

## 2021-11-03 DIAGNOSIS — F603 Borderline personality disorder: Secondary | ICD-10-CM | POA: Diagnosis not present

## 2021-11-04 DIAGNOSIS — M2740 Unspecified cyst of jaw: Secondary | ICD-10-CM | POA: Diagnosis not present

## 2021-11-05 DIAGNOSIS — Z713 Dietary counseling and surveillance: Secondary | ICD-10-CM | POA: Diagnosis not present

## 2021-11-10 DIAGNOSIS — F603 Borderline personality disorder: Secondary | ICD-10-CM | POA: Diagnosis not present

## 2021-11-17 DIAGNOSIS — F603 Borderline personality disorder: Secondary | ICD-10-CM | POA: Diagnosis not present

## 2021-11-19 DIAGNOSIS — Z713 Dietary counseling and surveillance: Secondary | ICD-10-CM | POA: Diagnosis not present

## 2021-11-19 DIAGNOSIS — J342 Deviated nasal septum: Secondary | ICD-10-CM | POA: Diagnosis not present

## 2021-11-19 DIAGNOSIS — R519 Headache, unspecified: Secondary | ICD-10-CM | POA: Diagnosis not present

## 2021-11-19 DIAGNOSIS — J310 Chronic rhinitis: Secondary | ICD-10-CM | POA: Diagnosis not present

## 2021-11-24 DIAGNOSIS — F603 Borderline personality disorder: Secondary | ICD-10-CM | POA: Diagnosis not present

## 2021-12-26 ENCOUNTER — Other Ambulatory Visit: Payer: Self-pay | Admitting: Nurse Practitioner

## 2021-12-26 NOTE — Telephone Encounter (Signed)
Patient is overdue for appointment. Please call to schedule.  

## 2021-12-26 NOTE — Telephone Encounter (Signed)
Requested medication (s) are due for refill today: yes  Requested medication (s) are on the active medication list: yes  Last refill:  10/09/2020  Future visit scheduled: no  Notes to clinic:  Failed protocol due to no valid visit within 12 months,  no upcoming appt scheduled, please assess.  Requested Prescriptions  Pending Prescriptions Disp Refills   ELURYNG 0.12-0.015 MG/24HR vaginal ring [Pharmacy Med Name: ELURYNG] 3 each 4    Sig: INSERT 1 RING VAGINALLY EVERY 28 DAYS     OB/GYN:  Contraceptives Failed - 12/26/2021  3:43 AM      Failed - Valid encounter within last 12 months    Recent Outpatient Visits           1 year ago Encounter for annual physical exam   [facility] Marjie Skiff, NP   1 year ago Elevated TSH   [facility] [place], [place], [place]   1 year ago Acute neck pain   [name] [place], [place]              Passed - Last BP in normal range    BP Readings from Last 1 Encounters:  02/13/21 (!) 96/57

## 2022-01-01 ENCOUNTER — Other Ambulatory Visit: Payer: Self-pay | Admitting: Nurse Practitioner

## 2022-01-01 NOTE — Telephone Encounter (Signed)
Requested medication (s) are due for refill today: yes  Requested medication (s) are on the active medication list: yes  Last refill:  09/30/21  Future visit scheduled: no  Notes to clinic:  last visit 11/10/2020, labs 10/09/2020, failed protocol of visit and labs within allowed time, no upcoming visit scheduled, please assess.  Requested Prescriptions  Pending Prescriptions Disp Refills   levothyroxine (SYNTHROID) 50 MCG tablet [Pharmacy Med Name: LEVOTHYROXINE 0.05MG  ( ) TAB] 90 tablet 3    Sig: TAKE 1 TABLET(50 MCG) BY MOUTH DAILY     Endocrinology:  Hypothyroid Agents Failed - 01/01/2022  3:45 AM      Failed - TSH needs to be rechecked within 3 months after an abnormal result. Refill until TSH is due.      Failed - Valid encounter within last 12 months    Recent Outpatient Visits           1 year ago Encounter for annual physical exam   [facility] [place], Corrie Dandy T, NP   1 year ago Elevated TSH   [facility] [place], [place], [place]   1 year ago Acute neck pain   [facility] [name] [place], [place]              Passed - TSH in normal range and within 360 days    TSH  Date Value Ref Range Status  01/08/2021 3.360 0.450 - 4.500 uIU/mL Final           spironolactone (ALDACTONE) 100 MG tablet [Pharmacy Med Name: SPIRONOLACTONE 100MG  TABLETS] 180 tablet 4    Sig: TAKE 2 TABLETS(200 MG) BY MOUTH DAILY     Cardiovascular: Diuretics - Aldosterone Antagonist Failed - 01/01/2022  3:45 AM      Failed - Cr in normal range and within 360 days    Creatinine, Ser  Date Value Ref Range Status  10/09/2020 0.75 0.57 - 1.00 mg/dL Final          Failed - K in normal range and within 360 days    Potassium  Date Value Ref Range Status  10/09/2020 4.6 3.5 - 5.2 mmol/L Final          Failed - Na in normal range and within 360 days    Sodium  Date Value Ref Range Status  10/09/2020 138 134 - 144 mmol/L Final           Failed - Valid encounter within last 6 months    Recent Outpatient Visits           1 year ago Encounter for annual physical exam   [facility] 13/02/2020, NP   1 year ago Elevated TSH   [facility] [place], [place], [place]   1 year ago Acute neck pain   [facility] [place], [place]              Passed - Last BP in normal range    BP Readings from Last 1 Encounters:  02/13/21 (!) 96/57

## 2022-01-01 NOTE — Telephone Encounter (Signed)
Lmom asking pt to call back to schedule an appt. °

## 2022-01-01 NOTE — Telephone Encounter (Signed)
Patient is overdue for appointment. Please call to schedule.  

## 2022-01-01 NOTE — Telephone Encounter (Signed)
Pt called to let office know she is no longer a patient and has a new PCP/ please advise
